# Patient Record
Sex: Male | Born: 1945
Health system: Southern US, Community
[De-identification: ages and names within clinical notes are randomized; demographics above are authoritative.]

## PROBLEM LIST (undated history)

## (undated) DIAGNOSIS — M545 Low back pain, unspecified: Secondary | ICD-10-CM

## (undated) DIAGNOSIS — E7800 Pure hypercholesterolemia, unspecified: Secondary | ICD-10-CM

## (undated) HISTORY — DX: Low back pain, unspecified: M54.50

## (undated) HISTORY — DX: Pure hypercholesterolemia, unspecified: E78.00

## (undated) HISTORY — PX: ESOPHAGOGASTRODUODENOSCOPY: SHX1529

## (undated) HISTORY — DX: Low back pain: M54.5

---

## 1998-09-01 ENCOUNTER — Encounter: Admission: RE | Admit: 1998-09-01 | Discharge: 1998-09-01 | Payer: Self-pay | Admitting: Family Medicine

## 1999-05-04 ENCOUNTER — Encounter: Admission: RE | Admit: 1999-05-04 | Discharge: 1999-05-04 | Payer: Self-pay | Admitting: Family Medicine

## 1999-07-10 ENCOUNTER — Encounter: Admission: RE | Admit: 1999-07-10 | Discharge: 1999-07-10 | Payer: Self-pay | Admitting: Family Medicine

## 2000-09-16 ENCOUNTER — Encounter: Admission: RE | Admit: 2000-09-16 | Discharge: 2000-09-16 | Payer: Self-pay | Admitting: Family Medicine

## 2002-03-23 ENCOUNTER — Encounter: Admission: RE | Admit: 2002-03-23 | Discharge: 2002-03-23 | Payer: Self-pay | Admitting: Family Medicine

## 2002-03-26 ENCOUNTER — Encounter: Admission: RE | Admit: 2002-03-26 | Discharge: 2002-03-26 | Payer: Self-pay | Admitting: Sports Medicine

## 2002-03-30 ENCOUNTER — Encounter: Admission: RE | Admit: 2002-03-30 | Discharge: 2002-03-30 | Payer: Self-pay | Admitting: Family Medicine

## 2003-04-12 ENCOUNTER — Encounter: Admission: RE | Admit: 2003-04-12 | Discharge: 2003-04-12 | Payer: Self-pay | Admitting: Family Medicine

## 2003-05-27 ENCOUNTER — Encounter: Admission: RE | Admit: 2003-05-27 | Discharge: 2003-05-27 | Payer: Self-pay | Admitting: Family Medicine

## 2005-01-22 ENCOUNTER — Ambulatory Visit: Payer: Self-pay | Admitting: Family Medicine

## 2005-01-22 ENCOUNTER — Encounter: Admission: RE | Admit: 2005-01-22 | Discharge: 2005-01-22 | Payer: Self-pay | Admitting: Family Medicine

## 2005-12-17 ENCOUNTER — Ambulatory Visit: Payer: Self-pay | Admitting: Family Medicine

## 2005-12-27 ENCOUNTER — Ambulatory Visit: Payer: Self-pay | Admitting: Family Medicine

## 2006-05-12 DIAGNOSIS — E7800 Pure hypercholesterolemia, unspecified: Secondary | ICD-10-CM | POA: Insufficient documentation

## 2006-06-01 ENCOUNTER — Encounter: Payer: Self-pay | Admitting: Family Medicine

## 2007-02-13 ENCOUNTER — Ambulatory Visit: Payer: Self-pay | Admitting: Family Medicine

## 2007-02-13 DIAGNOSIS — L570 Actinic keratosis: Secondary | ICD-10-CM

## 2007-02-14 ENCOUNTER — Ambulatory Visit: Payer: Self-pay | Admitting: Family Medicine

## 2007-02-14 ENCOUNTER — Encounter: Payer: Self-pay | Admitting: Family Medicine

## 2007-02-15 ENCOUNTER — Encounter: Payer: Self-pay | Admitting: Family Medicine

## 2007-02-15 LAB — CONVERTED CEMR LAB
ALT: 24 units/L (ref 0–53)
AST: 15 units/L (ref 0–37)
Albumin: 4.2 g/dL (ref 3.5–5.2)
Alkaline Phosphatase: 83 units/L (ref 39–117)
BUN: 17 mg/dL (ref 6–23)
CO2: 26 meq/L (ref 19–32)
Calcium: 9.2 mg/dL (ref 8.4–10.5)
Chloride: 105 meq/L (ref 96–112)
Cholesterol: 228 mg/dL — ABNORMAL HIGH (ref 0–200)
Creatinine, Ser: 0.88 mg/dL (ref 0.40–1.50)
Glucose, Bld: 108 mg/dL — ABNORMAL HIGH (ref 70–99)
HDL: 43 mg/dL (ref >39)
LDL Cholesterol: 162 mg/dL — ABNORMAL HIGH (ref 0–99)
PSA: 1.21 ng/mL (ref 0.10–4.00)
Potassium: 4.9 meq/L (ref 3.5–5.3)
Sodium: 142 meq/L (ref 135–145)
Total Bilirubin: 0.4 mg/dL (ref 0.3–1.2)
Total CHOL/HDL Ratio: 5.3
Total Protein: 7.3 g/dL (ref 6.0–8.3)
Triglycerides: 117 mg/dL (ref <150)
VLDL: 23 mg/dL (ref 0–40)

## 2007-03-20 ENCOUNTER — Telehealth: Payer: Self-pay | Admitting: *Deleted

## 2007-03-21 ENCOUNTER — Ambulatory Visit: Payer: Self-pay | Admitting: Family Medicine

## 2007-03-21 DIAGNOSIS — H905 Unspecified sensorineural hearing loss: Secondary | ICD-10-CM

## 2007-03-23 ENCOUNTER — Encounter: Payer: Self-pay | Admitting: Family Medicine

## 2007-03-23 ENCOUNTER — Telehealth (INDEPENDENT_AMBULATORY_CARE_PROVIDER_SITE_OTHER): Payer: Self-pay | Admitting: *Deleted

## 2007-03-23 ENCOUNTER — Encounter (INDEPENDENT_AMBULATORY_CARE_PROVIDER_SITE_OTHER): Payer: Self-pay | Admitting: *Deleted

## 2007-04-04 ENCOUNTER — Encounter: Payer: Self-pay | Admitting: Family Medicine

## 2007-04-05 ENCOUNTER — Ambulatory Visit: Payer: Self-pay | Admitting: Family Medicine

## 2007-04-05 LAB — CONVERTED CEMR LAB
ALT: 26 units/L (ref 0–53)
AST: 15 units/L (ref 0–37)
Albumin: 4.3 g/dL (ref 3.5–5.2)
Alkaline Phosphatase: 76 units/L (ref 39–117)
BUN: 21 mg/dL (ref 6–23)
CO2: 27 meq/L (ref 19–32)
CRP, High Sensitivity: 3.2 — ABNORMAL HIGH
Calcium: 9.5 mg/dL (ref 8.4–10.5)
Chloride: 105 meq/L (ref 96–112)
Cholesterol: 130 mg/dL (ref 0–200)
Creatinine, Ser: 1 mg/dL (ref 0.40–1.50)
Glucose, Bld: 103 mg/dL — ABNORMAL HIGH (ref 70–99)
HDL: 35 mg/dL — ABNORMAL LOW (ref >39)
LDL Cholesterol: 78 mg/dL (ref 0–99)
Potassium: 5.2 meq/L (ref 3.5–5.3)
Sodium: 141 meq/L (ref 135–145)
Total Bilirubin: 0.5 mg/dL (ref 0.3–1.2)
Total CHOL/HDL Ratio: 3.7
Total Protein: 7.2 g/dL (ref 6.0–8.3)
Triglycerides: 83 mg/dL (ref <150)
VLDL: 17 mg/dL (ref 0–40)

## 2007-09-19 IMAGING — CR DG CHEST 2V
2 series · 2 of 2 positions shown · non-contrast
Comparison: none

CLINICAL DATA: Two months cough.
 DIAGNOSTIC CHEST ? TWO VIEWS:
 No comparison.

[w chest pa]
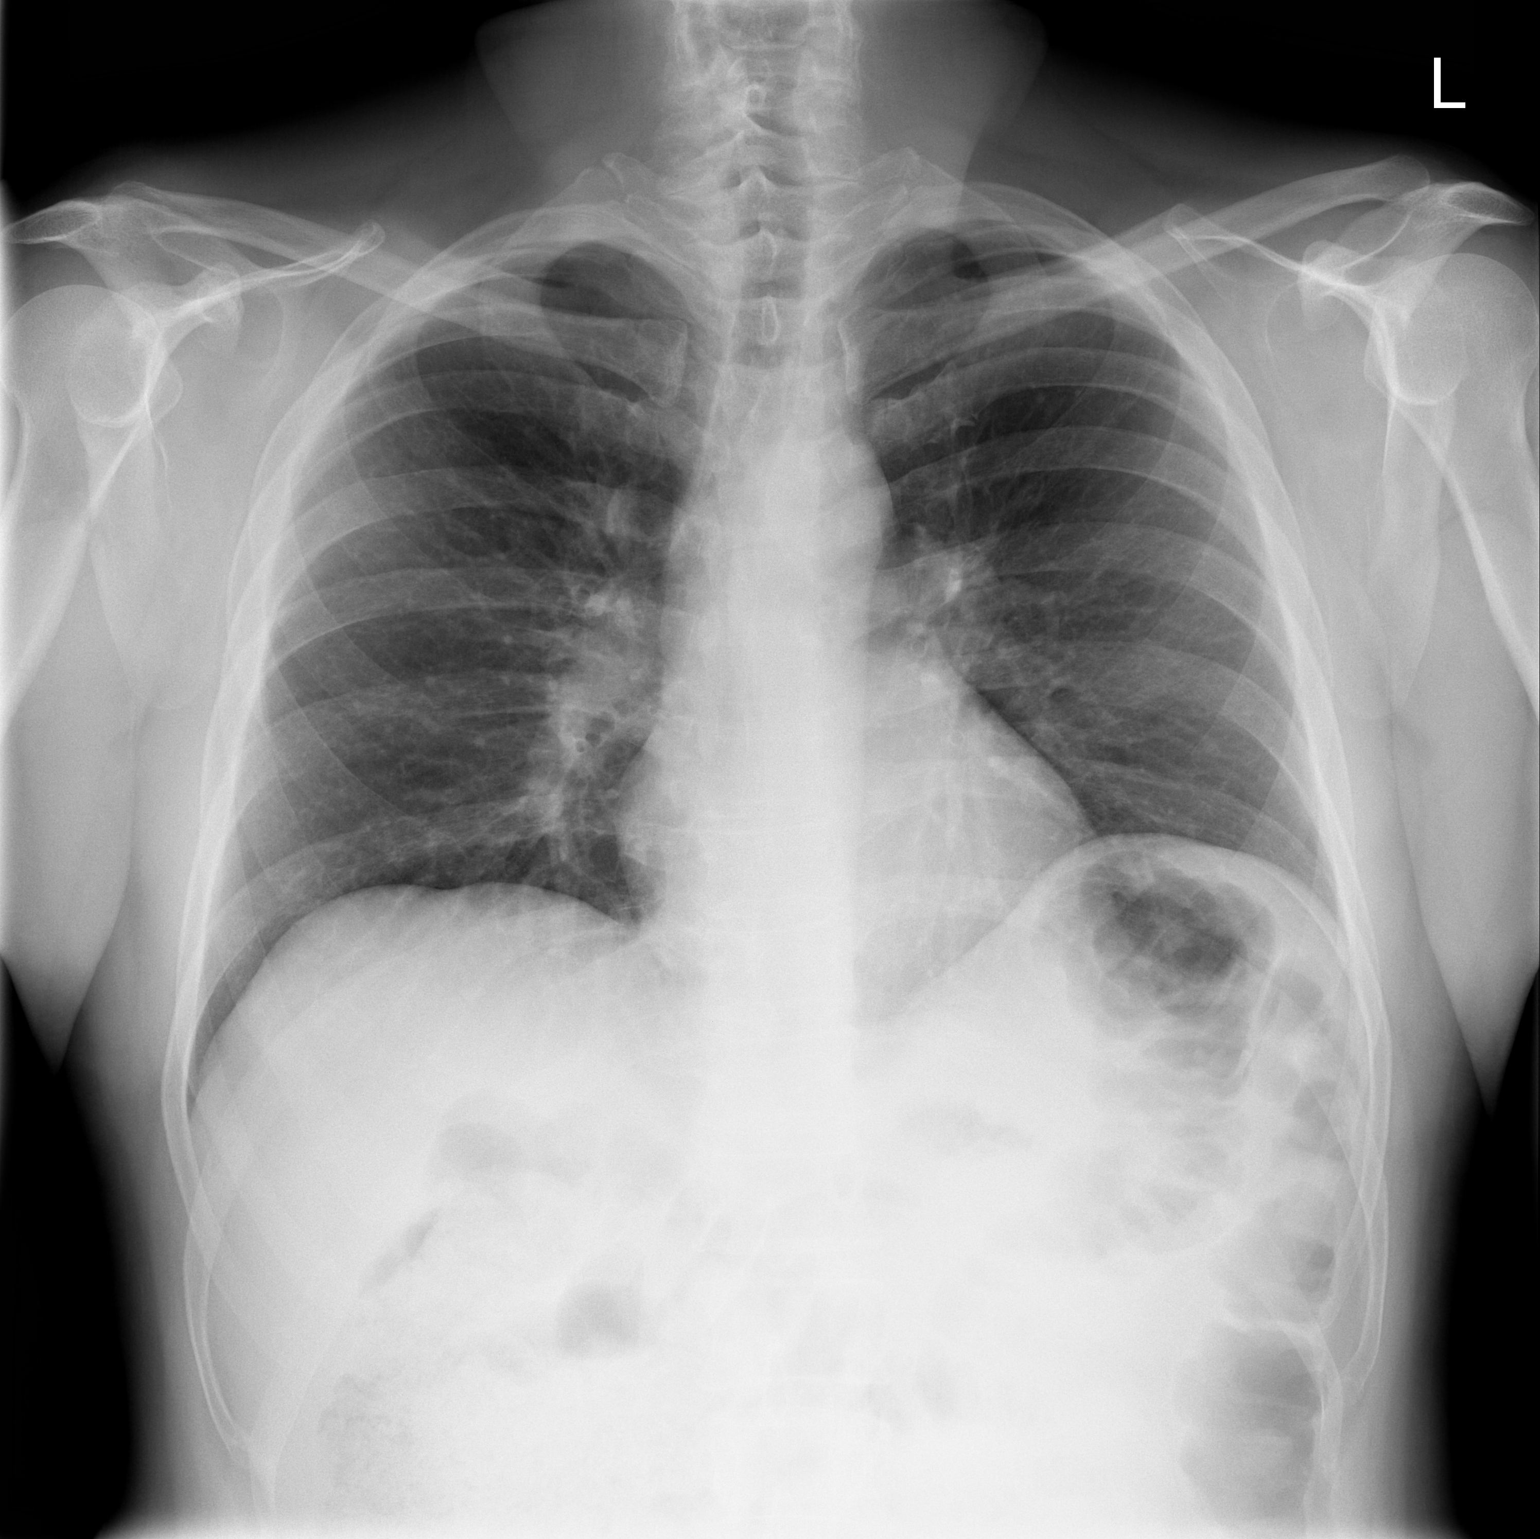

[w chest lat]
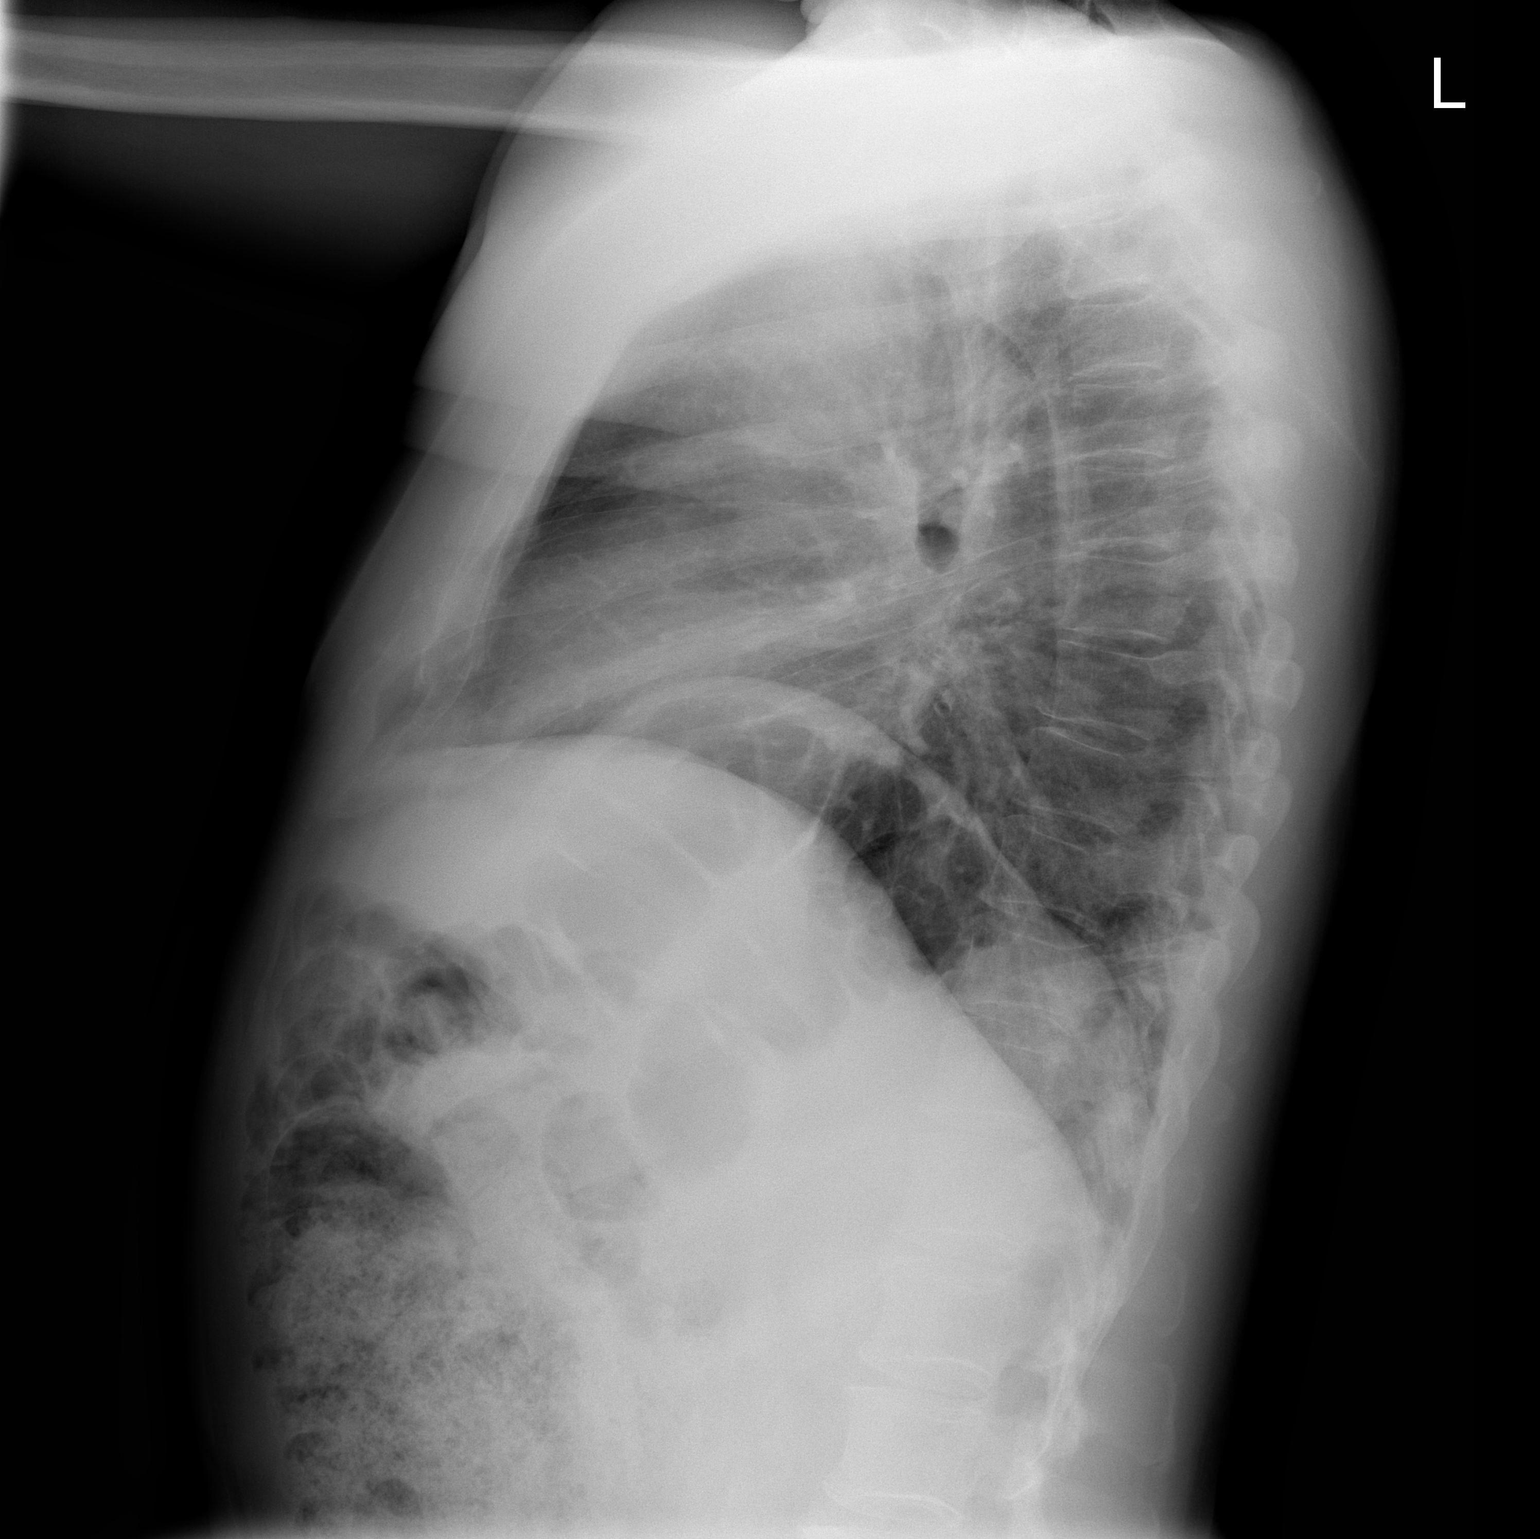

[2 of 2 positions shown; findings below may reference images not displayed]

FINDINGS: Submaximal inspiration is seen.  Prominence of bronchopulmonary markings of slight asthma or bronchitis are seen with the lungs otherwise clear.  Heart size is normal.   The mediastinum, hila, pleura and osseous structures are unremarkable.
IMPRESSION: 1.  Submaximal inspiration.
 2.  Slight changes of asthma or bronchitis.
 3.  Otherwise no active disease.

## 2008-01-10 ENCOUNTER — Encounter: Payer: Self-pay | Admitting: Family Medicine

## 2008-02-28 ENCOUNTER — Ambulatory Visit: Payer: Self-pay | Admitting: Family Medicine

## 2008-02-28 LAB — CONVERTED CEMR LAB
ALT: 39 units/L (ref 0–53)
AST: 25 units/L (ref 0–37)
Albumin: 4.6 g/dL (ref 3.5–5.2)
Alkaline Phosphatase: 75 units/L (ref 39–117)
BUN: 19 mg/dL (ref 6–23)
CO2: 25 meq/L (ref 19–32)
Calcium: 9.5 mg/dL (ref 8.4–10.5)
Chloride: 100 meq/L (ref 96–112)
Cholesterol: 140 mg/dL (ref 0–200)
Creatinine, Ser: 0.98 mg/dL (ref 0.40–1.50)
Glucose, Bld: 70 mg/dL (ref 70–99)
HDL: 47 mg/dL (ref >39)
LDL Cholesterol: 82 mg/dL (ref 0–99)
Potassium: 4.5 meq/L (ref 3.5–5.3)
Sodium: 140 meq/L (ref 135–145)
Total Bilirubin: 0.7 mg/dL (ref 0.3–1.2)
Total CHOL/HDL Ratio: 3
Total Protein: 7.6 g/dL (ref 6.0–8.3)
Triglycerides: 54 mg/dL (ref <150)
VLDL: 11 mg/dL (ref 0–40)
Vit D, 1,25-Dihydroxy: 41 (ref 30–89)

## 2009-01-17 ENCOUNTER — Encounter: Payer: Self-pay | Admitting: Family Medicine

## 2009-02-21 ENCOUNTER — Ambulatory Visit: Payer: Self-pay | Admitting: Family Medicine

## 2009-02-21 LAB — CONVERTED CEMR LAB
ALT: 25 units/L (ref 0–53)
AST: 23 units/L (ref 0–37)
Albumin: 4.6 g/dL (ref 3.5–5.2)
Alkaline Phosphatase: 69 units/L (ref 39–117)
BUN: 18 mg/dL (ref 6–23)
CO2: 21 meq/L (ref 19–32)
Calcium: 9.1 mg/dL (ref 8.4–10.5)
Chloride: 101 meq/L (ref 96–112)
Cholesterol: 139 mg/dL (ref 0–200)
Creatinine, Ser: 1.01 mg/dL (ref 0.40–1.50)
Glucose, Bld: 74 mg/dL (ref 70–99)
HDL: 47 mg/dL (ref >39)
LDL Cholesterol: 82 mg/dL (ref 0–99)
PSA: 1.05 ng/mL (ref 0.10–4.00)
Potassium: 5 meq/L (ref 3.5–5.3)
Sodium: 138 meq/L (ref 135–145)
Total Bilirubin: 0.7 mg/dL (ref 0.3–1.2)
Total CHOL/HDL Ratio: 3
Total Protein: 7.3 g/dL (ref 6.0–8.3)
Triglycerides: 49 mg/dL (ref <150)
VLDL: 10 mg/dL (ref 0–40)

## 2009-02-28 ENCOUNTER — Ambulatory Visit: Payer: Self-pay | Admitting: Family Medicine

## 2009-04-11 ENCOUNTER — Ambulatory Visit: Payer: Self-pay | Admitting: Family Medicine

## 2009-04-22 ENCOUNTER — Telehealth: Payer: Self-pay | Admitting: Family Medicine

## 2010-01-09 ENCOUNTER — Ambulatory Visit: Payer: Self-pay | Admitting: Family Medicine

## 2010-03-12 ENCOUNTER — Telehealth: Payer: Self-pay | Admitting: Family Medicine

## 2010-03-23 ENCOUNTER — Ambulatory Visit: Admission: RE | Admit: 2010-03-23 | Discharge: 2010-03-23 | Payer: Self-pay | Source: Home / Self Care

## 2010-03-23 ENCOUNTER — Encounter: Payer: Self-pay | Admitting: Family Medicine

## 2010-03-24 LAB — CONVERTED CEMR LAB
AST: 18 units/L (ref 0–37)
Albumin: 4.4 g/dL (ref 3.5–5.2)
Alkaline Phosphatase: 69 units/L (ref 39–117)
BUN: 17 mg/dL (ref 6–23)
Creatinine, Ser: 1.03 mg/dL (ref 0.40–1.50)
Glucose, Bld: 95 mg/dL (ref 70–99)
HDL: 47 mg/dL (ref >39)
LDL Cholesterol: 85 mg/dL (ref 0–99)
Total Bilirubin: 0.5 mg/dL (ref 0.3–1.2)
Total CHOL/HDL Ratio: 3
Triglycerides: 53 mg/dL (ref <150)
VLDL: 11 mg/dL (ref 0–40)

## 2010-03-25 ENCOUNTER — Ambulatory Visit
Admission: RE | Admit: 2010-03-25 | Discharge: 2010-03-25 | Payer: Self-pay | Source: Home / Self Care | Attending: Family Medicine | Admitting: Family Medicine

## 2010-04-16 NOTE — Assessment & Plan Note (Signed)
Summary: cpe/eo   Vital Signs:  Patient profile:   65 year old male Height:      67.5 inches Weight:      169 pounds BMI:     26.17 Temp:     98.4 degrees F oral Pulse rate:   64 / minute Pulse rhythm:   regular BP sitting:   130 / 74  (left arm) Cuff size:   regular  Vitals Entered By: Loralee Pacas CMA (March 25, 2010 2:24 PM) CC: cpe Is Patient Diabetic? No Pain Assessment Patient in pain? no        Primary Care Provider:  Doralee Albino MD  CC:  cpe.  History of Present Illness: For complete physical Retired - "I was born to be retired."  Happy, exercises regularly along with daily yoga and meditation.  Wt has dropped 10 lbs with retirement.   No concerns. Reviewed labs drawn prior to visit.  Habits & Providers  Alcohol-Tobacco-Diet     Alcohol drinks/day: <1     Alcohol Counseling: not indicated; patient does not drink     Tobacco Status: quit > 6 months     Year Quit: 30 yrs ago     Diet Comments: very healthy     Diet Counseling: not indicated; diet is assessed to be healthy  Exercise-Depression-Behavior     Does Patient Exercise: yes     Exercise Counseling: not indicated; exercise is adequate     Type of exercise: yoga and walking     Times/week: 7     Have you felt down or hopeless? no     Have you felt little pleasure in things? no     Depression Counseling: not indicated; screening negative for depression     STD Risk: never     Seat Belt Use: always     Sun Exposure: infrequent  Current Medications (verified): 1)  Ketoconazole 2 % Crea (Ketoconazole) .... Apply A Small Amount To Affected Area Twice A Day  Disp 30 Gram Tube 2)  Simvastatin 40 Mg  Tabs (Simvastatin) .... One By Mouth At Bedtime 3)  Aspirin 81 Mg  Tbec (Aspirin) .... One By Mouth Every Day  Allergies (verified): No Known Drug Allergies  Past History:  Past Medical History: Last updated: 05/12/2006 boderline elevated cholesterol, Hepatititis B and C antibody neg, low  back pain absent since ulcer Rx, ulcer H pylori + Rxed with no recurrence, viral hepatitis 1975  type A?, Zostavax given 10/07  Past Surgical History: Last updated: 05/12/2006 12/27/2005 ldl: 118 hdl: 46 tri: 70 - 01/19/2006, UGI endoscopy 1997 -  Family History: Last updated: 02/28/2009 - Ca, CVA, ETOHism, depression, + ASHD, DM, HBP, glaucoma + for prostate cancer so will screen until 74.  Social History: Last updated: 02/13/2007 nonsmoker; ETOH occaisional; retired Runner, broadcasting/film/video; exercise, walks 3 miles per day in summer, yoga year round; diet, very healthy.  Swims three times per week.  Risk Factors: Alcohol Use: <1 (03/25/2010) Diet: very healthy (03/25/2010) Exercise: yes (03/25/2010)  Risk Factors: Smoking Status: quit > 6 months (03/25/2010)  Social History: Sun Exposure-Excessive:  infrequent  Review of Systems  The patient denies anorexia, vision loss, decreased hearing, chest pain, syncope, dyspnea on exertion, peripheral edema, prolonged cough, abdominal pain, severe indigestion/heartburn, muscle weakness, unusual weight change, and abnormal bleeding.    Physical Exam  General:  Well-developed,well-nourished,in no acute distress; alert,appropriate and cooperative throughout examination Eyes:  No corneal or conjunctival inflammation noted. EOMI. Perrla. Funduscopic exam benign, without hemorrhages, exudates  or papilledema. Vision grossly normal. Ears:  External ear exam shows no significant lesions or deformities.  Otoscopic examination reveals clear canals, tympanic membranes are intact bilaterally without bulging, retraction, inflammation or discharge. Hearing is grossly normal bilaterally. Mouth:  Oral mucosa and oropharynx without lesions or exudates.  Teeth in good repair. Neck:  No deformities, masses, or tenderness noted. Lungs:  Normal respiratory effort, chest expands symmetrically. Lungs are clear to auscultation, no crackles or wheezes. Heart:  Normal rate and  regular rhythm. S1 and S2 normal without gallop, murmur, click, rub or other extra sounds. Abdomen:  Bowel sounds positive,abdomen soft and non-tender without masses, organomegaly or hernias noted. Extremities:  No clubbing, cyanosis, edema, or deformity noted with normal full range of motion of all joints.   Neurologic:  No cranial nerve deficits noted. Station and gait are normal. Plantar reflexes are down-going bilaterally. DTRs are symmetrical throughout. Sensory, motor and coordinative functions appear intact.   Impression & Recommendations:  Problem # 1:  Preventive Health Care (ICD-V70.0) Assessment Unchanged Great: up to date on all screening parameters.  Given remote hx of peptic ulcer with treated H pylori, and given + FH for CAD, will add 81 mg ASA daily for primary prevention of CAD.  No other changes.  Problem # 2:  HYPERCHOLESTEROLEMIA (ICD-272.0) Assessment: Improved  His updated medication list for this problem includes:    Simvastatin 40 Mg Tabs (Simvastatin) ..... One by mouth at bedtime  Labs Reviewed: SGOT: 18 (03/23/2010)   SGPT: 23 (03/23/2010)   HDL:47 (03/23/2010), 47 (02/21/2009)  LDL:85 (03/23/2010), 82 (02/21/2009)  Chol:143 (03/23/2010), 139 (02/21/2009)  Trig:53 (03/23/2010), 49 (02/21/2009)  Complete Medication List: 1)  Ketoconazole 2 % Crea (Ketoconazole) .... Apply a small amount to affected area twice a day  disp 30 gram tube 2)  Simvastatin 40 Mg Tabs (Simvastatin) .... One by mouth at bedtime 3)  Aspirin 81 Mg Tbec (Aspirin) .... One by mouth every day  Other Orders: [facility] - Est  65+ [phone])   Orders Added: 1)  [facility] - Est  65+ [[id number]]     Prevention & Chronic Care Immunizations   Influenza vaccine: Fluvax MCR  (01/09/2010)   Influenza vaccine due: 11/13/2009    Tetanus booster: 02/28/2009: Tdap   Tetanus booster due: 03/01/2019    Pneumococcal vaccine: Done.  (12/13/2005)   Pneumococcal vaccine due: None    H. zoster vaccine:  12/22/2005: given  Colorectal Screening   Hemoccult: Done.  (03/16/2003)   Hemoccult due: Not Indicated    Colonoscopy: Done.  (04/16/2003)   Colonoscopy due: 04/15/2013  Other Screening   PSA: 1.24  (03/23/2010)   PSA due due: 02/21/2010   Smoking status: quit > 6 months  (03/25/2010)  Lipids   Total Cholesterol: 143  (03/23/2010)   LDL: 85  (03/23/2010)   LDL Direct: Not documented   HDL: 47  (03/23/2010)   Triglycerides: 53  (03/23/2010)    SGOT (AST): 18  (03/23/2010)   SGPT (ALT): 23  (03/23/2010)   Alkaline phosphatase: 69  (03/23/2010)   Total bilirubin: 0.5  (03/23/2010)    Lipid flowsheet reviewed?: Yes   Progress toward LDL goal: At goal  Self-Management Support :   Personal Goals (by the next clinic visit) :      Personal LDL goal: 130  (02/28/2009)    Lipid self-management support: Written self-care plan  (02/28/2009)

## 2010-04-16 NOTE — Assessment & Plan Note (Signed)
Summary: flu shot,df  Nurse Visit   Vital Signs:  Patient profile:   65 year old male Temp:     98.5 degrees F  Vitals Entered By: Theresia Lo RN (January 09, 2010 4:20 PM)  Allergies: No Known Drug Allergies  Immunizations Administered:  Influenza Vaccine # 1:    Vaccine Type: Fluvax MCR    Site: left deltoid    Mfr: GlaxoSmithKline    Dose: 0.5 ml    Route: IM    Given by: Theresia Lo RN    Exp. Date: 09/09/2010    Lot #: [id number]    VIS given: 10/07/09 version given January 09, 2010.  Flu Vaccine Consent Questions:    Do you have a history of severe allergic reactions to this vaccine? no    Any prior history of allergic reactions to egg and/or gelatin? no    Do you have a sensitivity to the preservative Thimersol? no    Do you have a past history of Guillan-Barre Syndrome? no    Do you currently have an acute febrile illness? no    Have you ever had a severe reaction to latex? no    Vaccine information given and explained to patient? yes  Orders Added: 1)  Influenza Vaccine MCR [00025] 2)  Administration Flu vaccine - MCR [G0008]

## 2010-04-16 NOTE — Progress Notes (Signed)
Summary: Rx Req  Phone Note Refill Request Call back at [phone] Message from:  Patient  Refills Requested: Medication #1:  KETOCONAZOLE 2 % CREA Apply a small amount to affected area twice a day  Disp 30 gram tube PT IS OUT OF TOWN AND WOULD LIKE FOR IT TO BE SENT TO [facility] [place] THE NUMBER IS [phone].  Initial call taken by: Clydell Hakim,  April 22, 2009 9:17 AM  Follow-up for Phone Call        will forward to MD. Follow-up by: Theresia Lo RN,  April 22, 2009 11:27 AM  Additional Follow-up for Phone Call Additional follow up Details #1::        Rxed called as requested Additional Follow-up by: Doralee Albino MD,  April 22, 2009 12:23 PM    Additional Follow-up for Phone Call Additional follow up Details #2::    PATIENT NOTIFIED. Follow-up by: Theresia Lo RN,  April 22, 2009 1:49 PM

## 2010-04-16 NOTE — Assessment & Plan Note (Signed)
Summary: MOLE REMOVAL,DF   Vital Signs:  Patient profile:   65 year old male Height:      67.5 inches Weight:      179.56 pounds Temp:     98.0 degrees F oral Pulse rate:   67 / minute BP sitting:   131 / 78  (left arm)  Vitals Entered By: Terese Door (April 11, 2009 4:11 PM) CC: freeze mole on left shoulder Is Patient Diabetic? No Pain Assessment Patient in pain? no        Primary Care Provider:  Doralee Albino MD  CC:  freeze mole on left shoulder.  History of Present Illness: Has an elevated skin lesion on left should.    Habits & Providers  Alcohol-Tobacco-Diet     Tobacco Status: quit > 6 months  Current Medications (verified): 1)  Ketoconazole 2 % Crea (Ketoconazole) .... Apply A Small Amount To Affected Area Twice A Day  Disp 30 Gram Tube 2)  Simvastatin 40 Mg  Tabs (Simvastatin) .... One By Mouth At Bedtime  Allergies (verified): No Known Drug Allergies  Past History:  Past medical, surgical, family and social histories (including risk factors) reviewed, and no changes noted (except as noted below).  Past Medical History: Reviewed history from 05/12/2006 and no changes required. boderline elevated cholesterol, Hepatititis B and C antibody neg, low back pain absent since ulcer Rx, ulcer H pylori + Rxed with no recurrence, viral hepatitis 1975  type A?, Zostavax given 10/07  Past Surgical History: Reviewed history from 05/12/2006 and no changes required. 12/27/2005 ldl: 118 hdl: 46 tri: 70 - 01/19/2006, UGI endoscopy 1997 -  Family History: Reviewed history from 02/28/2009 and no changes required. - Ca, CVA, ETOHism, depression, + ASHD, DM, HBP, glaucoma + for prostate cancer so will screen until 74.  Social History: Reviewed history from 02/13/2007 and no changes required. nonsmoker; ETOH occaisional; retired Runner, broadcasting/film/video; exercise, walks 3 miles per day in summer, yoga year round; diet, very healthy.  Swims three times per week.Smoking Status:  quit >  6 months  Physical Exam  General:  Well-developed,well-nourished,in no acute distress; alert,appropriate and cooperative throughout examination Skin:  actinic keratosis, left shoulder, frozen   Impression & Recommendations:  Problem # 1:  ACTINIC KERATOSIS (ICD-702.0)  Orders: [facility]- Est Level  3 (04540)  Complete Medication List: 1)  Ketoconazole 2 % Crea (Ketoconazole) .... Apply a small amount to affected area twice a day  disp 30 gram tube 2)  Simvastatin 40 Mg Tabs (Simvastatin) .... One by mouth at bedtime  Patient Instructions: 1)  You had an actinic keratosis.  Treat the wound with antibiotic ointment and a bandaid.

## 2010-04-16 NOTE — Progress Notes (Signed)
Summary: Orders  Phone Note Call from Patient Call back at [phone]   Summary of Call: pt req lab order for his physical on 1/11. Initial call taken by: Knox Royalty,  March 12, 2010 3:28 PM  Follow-up for Phone Call        orders entered and patient notified Follow-up by: Doralee Albino MD,  March 13, 2010 8:41 AM

## 2010-05-26 ENCOUNTER — Encounter: Payer: Self-pay | Admitting: Home Health Services

## 2010-07-10 ENCOUNTER — Telehealth: Payer: Self-pay | Admitting: Family Medicine

## 2010-07-10 NOTE — Telephone Encounter (Signed)
Dr. Leveda Anna, I have appended the visit for 03/25/10 in [facility] to add the Welcome to Medicare procedure code.  Please go into appended visit and sign off on this.  I will also ask the billing office to resubmit the charge so that Mr. Sessums will not be billed.  They have already received one form their insurance company.

## 2010-07-13 NOTE — Telephone Encounter (Signed)
Done

## 2011-02-16 ENCOUNTER — Other Ambulatory Visit: Payer: Self-pay | Admitting: Family Medicine

## 2011-02-16 DIAGNOSIS — E7800 Pure hypercholesterolemia, unspecified: Secondary | ICD-10-CM

## 2011-02-16 MED ORDER — SIMVASTATIN 40 MG PO TABS: 40.0000 mg | ORAL_TABLET | Freq: Every day | ORAL | Status: DC

## 2011-02-16 NOTE — Assessment & Plan Note (Signed)
Refilled per request.

## 2011-03-22 ENCOUNTER — Telehealth: Payer: Self-pay | Admitting: Family Medicine

## 2011-03-22 DIAGNOSIS — R1900 Intra-abdominal and pelvic swelling, mass and lump, unspecified site: Secondary | ICD-10-CM

## 2011-03-22 NOTE — Assessment & Plan Note (Signed)
I have copied the e mail correspondence: I guess I'd better get it "ultrasounded", eh. I pretty impressed that you send a reply on a Sunday. I'm glad you're my doctor. Rosanne Ashing ---- "Hensel wrote:  > The little followed but official recommendation is for men who have ever smoked to have a one time age 67 abdominal ultrasound to screen for an aortic aneurism.  Let me know and I will have my nurse set this up for you.   > Bill >  > Sent from my iPad >  > On Mar 21, 2011, at 12:49 PM, "[email or web address] wrote: >  > > Hi Dr. Leveda Anna, > > Happy New Year! I will be coming in for my yearly checkup in January. 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 Today she called to let me know that during a demonstration of a supine pose she noticed a pulse in my abdomen and that she would feel remiss if she didn't mention it. I had a brief discussion with her and she said that she recommends an abdominal ultrasound for men over 65. Anyway, I have no symptoms and wouldn't even have known that I had a pulse there. I looked it up on the [facility] website and found our a little bit about it. At any rate perhaps when I come in you could take a look. I'm only E-mailing you because I was afraid I may forget about it when I see you. See you soon. > > Aviva Signs

## 2011-03-22 NOTE — Telephone Encounter (Signed)
Forwarded to Dr. Leveda Anna for order to be placed.Loralee Pacas [place]

## 2011-03-22 NOTE — Telephone Encounter (Signed)
Patient spoke with Dr. Leveda Anna via email about having an Abdominal Ultrasound done.  Dr. Leveda Anna was going to put a referral in for this.  The patient can be reached at [phone] today or tomorrow but will be at his regular #'s after that.

## 2011-03-25 ENCOUNTER — Ambulatory Visit
Admission: RE | Admit: 2011-03-25 | Discharge: 2011-03-25 | Disposition: A | Discharge status: Home / Self Care | Payer: Medicare Other | Source: Ambulatory Visit | Attending: Family Medicine | Admitting: Family Medicine

## 2011-03-25 ENCOUNTER — Other Ambulatory Visit: Payer: Self-pay | Admitting: Family Medicine

## 2011-03-25 DIAGNOSIS — R1900 Intra-abdominal and pelvic swelling, mass and lump, unspecified site: Secondary | ICD-10-CM

## 2011-03-25 DIAGNOSIS — E7800 Pure hypercholesterolemia, unspecified: Secondary | ICD-10-CM

## 2011-03-25 NOTE — Assessment & Plan Note (Signed)
Will check labs in advance of his annual assessment

## 2011-03-30 ENCOUNTER — Other Ambulatory Visit: Payer: Medicare Other

## 2011-03-30 DIAGNOSIS — E7800 Pure hypercholesterolemia, unspecified: Secondary | ICD-10-CM

## 2011-03-30 NOTE — Progress Notes (Signed)
cmp and flp done today John Chung 

## 2011-03-31 ENCOUNTER — Ambulatory Visit (INDEPENDENT_AMBULATORY_CARE_PROVIDER_SITE_OTHER): Payer: Medicare Other | Admitting: Family Medicine

## 2011-03-31 ENCOUNTER — Encounter: Payer: Self-pay | Admitting: Family Medicine

## 2011-03-31 VITALS — BP 130/68 | HR 64 | Temp 98.1°F | Ht 67.5 in | Wt 174.8 lb

## 2011-03-31 DIAGNOSIS — R1900 Intra-abdominal and pelvic swelling, mass and lump, unspecified site: Secondary | ICD-10-CM

## 2011-03-31 DIAGNOSIS — E7800 Pure hypercholesterolemia, unspecified: Secondary | ICD-10-CM

## 2011-03-31 DIAGNOSIS — Z23 Encounter for immunization: Secondary | ICD-10-CM

## 2011-03-31 LAB — COMPLETE METABOLIC PANEL WITH GFR
ALT: 28 U/L (ref 0–53)
CO2: 30 mEq/L (ref 19–32)
Calcium: 9.5 mg/dL (ref 8.4–10.5)
Chloride: 105 mEq/L (ref 96–112)
GFR, Est African American: >89 mL/min
Sodium: 141 mEq/L (ref 135–145)
Total Bilirubin: 0.5 mg/dL (ref 0.3–1.2)
Total Protein: 6.9 g/dL (ref 6.0–8.3)

## 2011-03-31 LAB — LIPID PANEL
HDL: 46 mg/dL (ref >39)
LDL Cholesterol: 77 mg/dL (ref 0–99)
VLDL: 15 mg/dL (ref 0–40)

## 2011-03-31 MED ORDER — PNEUMOCOCCAL VAC POLYVALENT 25 MCG/0.5ML IJ INJ: 0.5000 mL | INJECTION | Freq: Once | INTRAMUSCULAR | Status: DC

## 2011-03-31 NOTE — Patient Instructions (Addendum)
You received your last ever pneumonia shot today.  You don't need any more. Remember to start taking a baby (81 mg) aspirin every day. See Arlys John for your annual medicare wellness exam.

## 2011-04-02 NOTE — Progress Notes (Signed)
  Subjective:    Patient ID: John Chung, male    DOB: 25-Jan-1946, 66 y.o.   MRN: [id number]  HPI  John Chung comes in for annual recheck of his annual recheck of hypercholesterolemia.  He has a very healthy lifestyle.  He eats well and does 3 hours of yoga daily (1 hour meditation and 2 hours of exercise.)  He has no complaints.  Tolerating simvastatin well  He has not been taking his ASA.  We discussed again primary CAD, CVA prevention and he does have a family hx of both.  He worries since he had a previous ulcer - but that was years ago and related to H pylori, which was treated.  Recommended 81 mg ASA    Review of Systems     Objective:   Physical Exam Lungs clear Cardiac RRR without m or g Abd benign Ext no edema.  Nl pulses.        Assessment & Plan:

## 2011-04-02 NOTE — Assessment & Plan Note (Signed)
Cholesterol panel great on simvastatin.  We discussed again primary CAD, CVA prevention and he does have a family hx of both.  He worries since he had a previous ulcer - but that was years ago and related to H pylori, which was treated.  Recommended 81 mg ASA

## 2011-04-02 NOTE — Assessment & Plan Note (Signed)
Ultrasound showed nl aorta.

## 2011-12-13 ENCOUNTER — Encounter (INDEPENDENT_AMBULATORY_CARE_PROVIDER_SITE_OTHER): Payer: Medicare Other | Admitting: Ophthalmology

## 2011-12-13 DIAGNOSIS — H35379 Puckering of macula, unspecified eye: Secondary | ICD-10-CM

## 2011-12-13 DIAGNOSIS — H2510 Age-related nuclear cataract, unspecified eye: Secondary | ICD-10-CM

## 2011-12-13 DIAGNOSIS — H3530 Unspecified macular degeneration: Secondary | ICD-10-CM

## 2011-12-13 DIAGNOSIS — H43819 Vitreous degeneration, unspecified eye: Secondary | ICD-10-CM

## 2011-12-14 ENCOUNTER — Encounter: Payer: Self-pay | Admitting: Family Medicine

## 2011-12-14 ENCOUNTER — Other Ambulatory Visit: Payer: Self-pay | Admitting: Family Medicine

## 2011-12-14 DIAGNOSIS — H3530 Unspecified macular degeneration: Secondary | ICD-10-CM | POA: Insufficient documentation

## 2011-12-14 MED ORDER — PRESERVISION AREDS 2 PO CAPS: 1.0000 | ORAL_CAPSULE | Freq: Every day | ORAL | Status: AC

## 2011-12-14 NOTE — Assessment & Plan Note (Signed)
Vits per ophth

## 2012-02-21 ENCOUNTER — Other Ambulatory Visit: Payer: Self-pay | Admitting: Family Medicine

## 2012-03-23 ENCOUNTER — Telehealth: Payer: Self-pay | Admitting: Family Medicine

## 2012-03-23 DIAGNOSIS — E7800 Pure hypercholesterolemia, unspecified: Secondary | ICD-10-CM

## 2012-03-23 NOTE — Telephone Encounter (Signed)
[employer] will not pay for a CPE anymore but patient is asking for Dr Leveda Anna to put orders in for the same labs as last year.  Please let him know when orders are in.

## 2012-03-23 NOTE — Telephone Encounter (Signed)
Will route request to Dr. Leveda Anna.  Gaylene Brooks, RN

## 2012-03-24 NOTE — Assessment & Plan Note (Signed)
Will order labs - future orders

## 2012-03-24 NOTE — Telephone Encounter (Signed)
Patient informed and scheduled fasting lab appt for 04/04/12 at 8:45 am.  Gaylene Brooks, RN

## 2012-04-04 ENCOUNTER — Other Ambulatory Visit: Payer: Medicare PPO

## 2012-04-04 DIAGNOSIS — E7800 Pure hypercholesterolemia, unspecified: Secondary | ICD-10-CM

## 2012-04-04 NOTE — Progress Notes (Signed)
CMP AND FLP DONE TODAY Braylyn Kalter 

## 2012-04-05 ENCOUNTER — Encounter: Payer: Self-pay | Admitting: Family Medicine

## 2012-04-05 LAB — LIPID PANEL
HDL: 50 mg/dL (ref >39)
LDL Cholesterol: 85 mg/dL (ref 0–99)
Total CHOL/HDL Ratio: 3 Ratio

## 2012-04-05 LAB — COMPLETE METABOLIC PANEL WITH GFR
ALT: 29 U/L (ref 0–53)
Alkaline Phosphatase: 62 U/L (ref 39–117)
CO2: 29 mEq/L (ref 19–32)
Sodium: 141 mEq/L (ref 135–145)
Total Bilirubin: 0.5 mg/dL (ref 0.3–1.2)
Total Protein: 7.1 g/dL (ref 6.0–8.3)

## 2012-04-12 ENCOUNTER — Encounter: Payer: Medicare Other | Admitting: Family Medicine

## 2012-06-14 ENCOUNTER — Ambulatory Visit (INDEPENDENT_AMBULATORY_CARE_PROVIDER_SITE_OTHER): Payer: Medicare PPO | Admitting: Family Medicine

## 2012-06-14 ENCOUNTER — Encounter: Payer: Self-pay | Admitting: Family Medicine

## 2012-06-14 VITALS — BP 116/59 | HR 57 | Temp 98.1°F | Ht 67.5 in | Wt 158.0 lb

## 2012-06-14 DIAGNOSIS — L570 Actinic keratosis: Secondary | ICD-10-CM

## 2012-06-15 NOTE — Assessment & Plan Note (Signed)
AK on arm, seb K on torso.

## 2012-06-15 NOTE — Patient Instructions (Signed)
You have been through this before.  Keep triple antibiotic once frozen areas blister.

## 2012-06-15 NOTE — Progress Notes (Signed)
  Subjective:    Patient ID: John Chung, male    DOB: 05-31-1945, 67 y.o.   MRN: [id number]  HPI Several skin lesions to evaluate.  Biggest on arm. Several on torso    Review of Systems     Objective:   Physical Exam Arm is large AK Torso multiple seb Ks.  All frozen        Assessment & Plan:

## 2012-09-13 ENCOUNTER — Ambulatory Visit (INDEPENDENT_AMBULATORY_CARE_PROVIDER_SITE_OTHER): Payer: Medicare Other | Admitting: Ophthalmology

## 2012-09-20 ENCOUNTER — Ambulatory Visit (INDEPENDENT_AMBULATORY_CARE_PROVIDER_SITE_OTHER): Payer: Medicare HMO | Admitting: Ophthalmology

## 2012-09-20 DIAGNOSIS — H35379 Puckering of macula, unspecified eye: Secondary | ICD-10-CM

## 2012-09-20 DIAGNOSIS — H43819 Vitreous degeneration, unspecified eye: Secondary | ICD-10-CM

## 2012-09-20 DIAGNOSIS — H3530 Unspecified macular degeneration: Secondary | ICD-10-CM

## 2012-09-20 DIAGNOSIS — H2510 Age-related nuclear cataract, unspecified eye: Secondary | ICD-10-CM

## 2012-09-25 ENCOUNTER — Telehealth: Payer: Self-pay | Admitting: Family Medicine

## 2012-09-25 NOTE — Telephone Encounter (Signed)
Pt is  Having problem with the right hand where the  base of thumb is. He made an appointment for 9/10 to see Dr. Leveda Anna and just wants advice on what to do until then. He didn't want to come in before that. JW

## 2012-09-25 NOTE — Telephone Encounter (Signed)
Left message for pt to call for earlier appointment and return call for advice as needed. Wyatt Haste, RN-BSN

## 2012-09-26 NOTE — Telephone Encounter (Signed)
Called and discussed.  He has thumb pain and a "nodule"  Also has guitar camp coming up.  Will be happy to see him (8/22 would be a day that works for him and I am seeing patients that morning.)  Also, I would be happy to refer to hand surgeon if he want prompt, definitive eval before guitar camp.

## 2012-09-28 ENCOUNTER — Encounter: Payer: Self-pay | Admitting: Sports Medicine

## 2012-09-28 ENCOUNTER — Ambulatory Visit (INDEPENDENT_AMBULATORY_CARE_PROVIDER_SITE_OTHER): Payer: Medicare PPO | Admitting: Sports Medicine

## 2012-09-28 ENCOUNTER — Ambulatory Visit: Payer: Medicare PPO

## 2012-09-28 VITALS — BP 124/79 | HR 60 | Temp 97.5°F | Ht 67.5 in | Wt 158.6 lb

## 2012-09-28 DIAGNOSIS — M6530 Trigger finger, unspecified finger: Secondary | ICD-10-CM | POA: Insufficient documentation

## 2012-09-28 MED ORDER — MELOXICAM 15 MG PO TABS: 15.0000 mg | ORAL_TABLET | Freq: Every day | ORAL | Status: DC

## 2012-09-28 NOTE — Patient Instructions (Addendum)
It was nice to see you today.   Today we discussed: 1. Trigger finger, acquired Try: - meloxicam (MOBIC) 15 MG tablet; Take 1 tablet (15 mg total) by mouth daily.  Dispense: 30 tablet; Refill: 0  Do gentle stretching and use ice up to 3 times per day.  Cool tap water is likely cool enough to soak your hand  Call [facility] (Dr. Darrick Penna and [place])to have this evaluated further if not significantly improved.  Their number is [phone]   Please plan to return to see Dr. Leveda Anna as needed.  If you need anything prior to seeing me please call the clinic.  ENJOY YOUR SUMMER!  Please Bring all medications with you to each appointment.

## 2012-09-28 NOTE — Assessment & Plan Note (Addendum)
Will tx with Mobic and continue conservative care per AVS and instructions provided pt Patient has seen  and specialist in the past but is not interested in returning at this time as he does not want to have surgery.   Patient is agreeable to this plan but would prefer to have had an injection today.   > Recommend that if not improved to return to [facility] clinic for ultrasound guided injection

## 2012-09-28 NOTE — Progress Notes (Signed)
  [facility]  Patient name: John Chung MRN [id number]  Date of birth: November 06, 1945  CC & HPI:  John Chung is a 67 y.o. male presenting today for:  # R Thumb pain:  Context  right thumb pain with flexion only.   Nodule on deep tendon appreciated   Onset/Duration:   2 months, progressively worsening.  Was initially popping slightly but now is tender with active flexion or passive extension/abduction  Illicit ing Event/Injury:  No  Character:   pain with movement that is nonradiating. He is a good sharp layer is able to hold his PICC without difficulty. Also has a Dupuytren's contracture of the fifth finger on the same side   Severity:  moderate to severe  Radiation:  none  Aggravating:  see above  Associated Symptoms:    Effective Therapies:  ice  Inneffective Therapies:  nothing  RED FLAGS:  no fevers, chill, erythema    ROS:  PER HPI  Pertinent History Reviewed:  Medical & Surgical Hx:  Reviewed: Significant for HLD,  Medications: Reviewed & Updated - see associated section Social History: Reviewed -  reports that he has quit smoking. He quit smokeless tobacco use about 31 years ago.  Objective Findings:  Vitals: BP 124/79  Pulse 60  Temp(Src) 97.5 F (36.4 C) (Oral)  Ht 5' 7.5" (1.715 m)  Wt 158 lb 9.6 oz (71.94 kg)  BMI 24.46 kg/m2  PE: GENERAL: Adult caucasian  male. Appears younger than ageIn no discomfort; no respiratory distress  PSYCH:  alert and appropriate, good insight   HNEENT:    CARDIO:    LUNGS:    ABDOMEN:    EXTREM:   GU:   SKIN:   NEUROMSK:  right thumb with nodule over the deep tendon of the flexor pollicus longus likely distal to the A1 pulley  Dupuytren's contracture of R 5th finger - patient is not interested in getting this fixed he.  He has had this for a long time.      Assessment & Plan:

## 2012-11-03 ENCOUNTER — Ambulatory Visit: Payer: Medicare PPO | Admitting: Family Medicine

## 2012-11-22 ENCOUNTER — Ambulatory Visit: Payer: Medicare PPO | Admitting: Family Medicine

## 2013-01-02 ENCOUNTER — Ambulatory Visit (INDEPENDENT_AMBULATORY_CARE_PROVIDER_SITE_OTHER): Payer: Medicare PPO | Admitting: *Deleted

## 2013-01-02 DIAGNOSIS — Z23 Encounter for immunization: Secondary | ICD-10-CM

## 2013-02-26 ENCOUNTER — Other Ambulatory Visit: Payer: Self-pay | Admitting: Family Medicine

## 2013-02-26 ENCOUNTER — Encounter: Payer: Self-pay | Admitting: *Deleted

## 2013-03-21 ENCOUNTER — Encounter: Payer: Self-pay | Admitting: Internal Medicine

## 2013-05-15 ENCOUNTER — Telehealth: Payer: Self-pay | Admitting: Family Medicine

## 2013-05-15 ENCOUNTER — Encounter: Payer: Self-pay | Admitting: Family Medicine

## 2013-05-15 DIAGNOSIS — Z114 Encounter for screening for human immunodeficiency virus [HIV]: Secondary | ICD-10-CM

## 2013-05-15 DIAGNOSIS — E7800 Pure hypercholesterolemia, unspecified: Secondary | ICD-10-CM

## 2013-05-15 DIAGNOSIS — M25519 Pain in unspecified shoulder: Secondary | ICD-10-CM

## 2013-05-15 NOTE — Telephone Encounter (Signed)
Yes. Could you put in a referral for insurance purposes. Thanks, Also,  need to have my yearly blood checkup. Clair Gulling ---- "Hensel wrote:  > [name], > Pain in and around the shoulder can be a little tricky to diagnose.  From a pure numbers standpoint, a rotator cuff strain would be more common than a deltoid strain.  As a physical therapist, Jenny Reichmann should be able to tell you if you got the diagnosis right and treat whatever ails you.   >  > If it is a rotator cuff problem, it can stubbornly refuse to go away.  Physical therapy is a good option.  I also give cortisone injections in the shoulder for problems that have failed conservative treatment. >  > Let me know if I need to put in a Physical Therapy referral for insurance purposes.  Good luck and hope you feel better soon. >  > Bill >  > -----Original Message----- > From: Marla Roe [[email or web address]]  > Sent: Monday, May 14, 2013 3:28 PM > To: Hensel, Bill > Subject: Deltoid strain >  >  >  >  > Hi Dr. Andria Frames, > I think I have a deltoid strain. I've been treating with my ultra sound and massage and ice and ibuprofen. I know it takes time. Do you have any advice? Can I go to [facility] [place]? Marcelline Deist

## 2013-05-15 NOTE — Telephone Encounter (Signed)
FU e mail encounter.  Wants HIV and Hep C screening with annual blood work.

## 2013-05-15 NOTE — Assessment & Plan Note (Signed)
Will screen for Hep c and HIV per latest recs.

## 2013-05-28 ENCOUNTER — Encounter: Payer: Self-pay | Admitting: Internal Medicine

## 2013-05-30 ENCOUNTER — Other Ambulatory Visit: Payer: Medicare PPO

## 2013-05-30 DIAGNOSIS — Z114 Encounter for screening for human immunodeficiency virus [HIV]: Secondary | ICD-10-CM

## 2013-05-30 DIAGNOSIS — E7800 Pure hypercholesterolemia, unspecified: Secondary | ICD-10-CM

## 2013-05-30 LAB — LIPID PANEL
Cholesterol: 164 mg/dL (ref 0–200)
HDL: 50 mg/dL (ref >39)
LDL Cholesterol: 100 mg/dL — ABNORMAL HIGH (ref 0–99)
Total CHOL/HDL Ratio: 3.3 Ratio
Triglycerides: 72 mg/dL (ref <150)
VLDL: 14 mg/dL (ref 0–40)

## 2013-05-30 LAB — COMPREHENSIVE METABOLIC PANEL
ALBUMIN: 4.3 g/dL (ref 3.5–5.2)
ALK PHOS: 67 U/L (ref 39–117)
ALT: 20 U/L (ref 0–53)
AST: 19 U/L (ref 0–37)
BILIRUBIN TOTAL: 0.5 mg/dL (ref 0.2–1.2)
BUN: 17 mg/dL (ref 6–23)
CO2: 32 mEq/L (ref 19–32)
Calcium: 9.2 mg/dL (ref 8.4–10.5)
Chloride: 102 mEq/L (ref 96–112)
Creat: 0.8 mg/dL (ref 0.50–1.35)
GLUCOSE: 92 mg/dL (ref 70–99)
POTASSIUM: 4.3 meq/L (ref 3.5–5.3)
SODIUM: 141 meq/L (ref 135–145)
TOTAL PROTEIN: 6.9 g/dL (ref 6.0–8.3)

## 2013-05-30 NOTE — Progress Notes (Signed)
CMP,FLP,HIV AND HEPCAB DONE TODAY John Chung

## 2013-05-31 LAB — HEPATITIS C ANTIBODY, REFLEX: HCV AB: NEGATIVE

## 2013-05-31 LAB — HIV ANTIBODY (ROUTINE TESTING W REFLEX): HIV: NONREACTIVE

## 2013-06-01 ENCOUNTER — Encounter: Payer: Self-pay | Admitting: Family Medicine

## 2013-06-27 ENCOUNTER — Ambulatory Visit (INDEPENDENT_AMBULATORY_CARE_PROVIDER_SITE_OTHER): Payer: Medicare PPO | Admitting: Family Medicine

## 2013-06-27 ENCOUNTER — Encounter: Payer: Self-pay | Admitting: Family Medicine

## 2013-06-27 VITALS — BP 114/68 | HR 63 | Temp 98.1°F | Ht 67.5 in | Wt 155.1 lb

## 2013-06-27 DIAGNOSIS — E7800 Pure hypercholesterolemia, unspecified: Secondary | ICD-10-CM

## 2013-06-27 DIAGNOSIS — M25519 Pain in unspecified shoulder: Secondary | ICD-10-CM

## 2013-06-27 DIAGNOSIS — Z23 Encounter for immunization: Secondary | ICD-10-CM

## 2013-06-29 NOTE — Assessment & Plan Note (Signed)
Well controled on current meds 

## 2013-06-29 NOTE — Assessment & Plan Note (Signed)
Improving.

## 2013-06-29 NOTE — Progress Notes (Signed)
   Subjective:    Patient ID: John Chung, male    DOB: 02-01-1946, 68 y.o.   MRN: [id number]  HPI Follow up multiple issues Left shoulder improving with PT Recent cholesterol check at goal.  Tolerating statin well Due for colonoscopy - already has an appointment. Will give prevnar 13.    Review of Systems     Objective:   Physical Exam Left shoulder FROM with only mild tenderness. Cardiac RRR without m or g Lungs clear.        Assessment & Plan:

## 2013-07-11 ENCOUNTER — Ambulatory Visit (AMBULATORY_SURGERY_CENTER): Payer: Self-pay

## 2013-07-11 VITALS — Ht 68.0 in | Wt 153.0 lb

## 2013-07-11 DIAGNOSIS — Z1211 Encounter for screening for malignant neoplasm of colon: Secondary | ICD-10-CM

## 2013-07-11 MED ORDER — MOVIPREP 100 G PO SOLR: 1.0000 | Freq: Once | ORAL | Status: DC

## 2013-07-11 NOTE — Progress Notes (Signed)
No allergies to eggs or soy. No diet/weight loss meds. No home oxygen. Has email.  Emmi instructions given for colonoscopy.

## 2013-07-25 ENCOUNTER — Ambulatory Visit (AMBULATORY_SURGERY_CENTER): Payer: Medicare PPO | Admitting: Internal Medicine

## 2013-07-25 ENCOUNTER — Encounter: Payer: Self-pay | Admitting: Internal Medicine

## 2013-07-25 VITALS — BP 109/70 | HR 52 | Temp 95.7°F | Resp 17 | Ht 68.0 in | Wt 153.0 lb

## 2013-07-25 DIAGNOSIS — Z1211 Encounter for screening for malignant neoplasm of colon: Secondary | ICD-10-CM

## 2013-07-25 MED ORDER — SODIUM CHLORIDE 0.9 % IV SOLN: 500.0000 mL | INTRAVENOUS | Status: DC

## 2013-07-25 NOTE — Op Note (Signed)
[place]  [address]. [place], [address]   COLONOSCOPY PROCEDURE REPORT  PATIENT: John Chung, John Chung  MR#: [id number] BIRTHDATE: [birth year] , 54  yrs. old GENDER: Male ENDOSCOPIST: Lafayette Dragon, MD REFERRED PQ:[id number] Andria Frames, M.D. PROCEDURE DATE:  07/25/2013 PROCEDURE:   Colonoscopy, screening First Screening Colonoscopy - Avg.  risk and is 50 yrs.  old or older - No.  Prior Negative Screening - Now for repeat screening. 10 or more years since last screening  History of Adenoma - Now for follow-up colonoscopy & has been > or = to 3 yrs.  N/A  Polyps Removed Today? No.  Recommend repeat exam, <10 yrs? No. ASA CLASS:   Class II INDICATIONS:Average risk patient for colon cancer and last colonoscopy in February 2005 was a normal exam.  History of bleeding duodenal ulcer in 1995, treated for H.pylori. MEDICATIONS: MAC sedation, administered by CRNA and Propofol (Diprivan) 270 mg IV  DESCRIPTION OF PROCEDURE:   After the risks benefits and alternatives of the procedure were thoroughly explained, informed consent was obtained.  A digital rectal exam revealed no abnormalities of the rectum.   The LB [id number]  endoscope was introduced through the anus and advanced to the cecum, which was identified by both the appendix and ileocecal valve. No adverse events experienced.   The quality of the prep was good, using MoviPrep  The instrument was then slowly withdrawn as the colon was fully examined.      COLON FINDINGS: A normal appearing cecum, ileocecal valve, and appendiceal orifice were identified.  The ascending, hepatic flexure, transverse, splenic flexure, descending, sigmoid colon and rectum appeared unremarkable.  No polyps or cancers were seen. Retroflexed views revealed no abnormalities. The time to cecum=7 minutes 10 seconds.  Withdrawal time=8 minutes 03 seconds.  The scope was withdrawn and the procedure completed. COMPLICATIONS: There were no  complications.  ENDOSCOPIC IMPRESSION: Normal colon  RECOMMENDATIONS: high fiber diet Recall colonoscopy in 10 years   eSigned:  Lafayette Dragon, MD 07/25/2013 8:25 AM   cc:

## 2013-07-25 NOTE — Patient Instructions (Signed)
YOU HAD AN ENDOSCOPIC PROCEDURE TODAY AT THE [facility]: Refer to the procedure report that was given to you for any specific questions about what was found during the examination.  If the procedure report does not answer your questions, please call your gastroenterologist to clarify.  If you requested that your care partner not be given the details of your procedure findings, then the procedure report has been included in a sealed envelope for you to review at your convenience later.  YOU SHOULD EXPECT: Some feelings of bloating in the abdomen. Passage of more gas than usual.  Walking can help get rid of the air that was put into your GI tract during the procedure and reduce the bloating. If you had a lower endoscopy (such as a colonoscopy or flexible sigmoidoscopy) you may notice spotting of blood in your stool or on the toilet paper. If you underwent a bowel prep for your procedure, then you may not have a normal bowel movement for a few days.  DIET: Your first meal following the procedure should be a light meal and then it is ok to progress to your normal diet.  A half-sandwich or bowl of soup is an example of a good first meal.  Heavy or fried foods are harder to digest and may make you feel nauseous or bloated.  Likewise meals heavy in dairy and vegetables can cause extra gas to form and this can also increase the bloating.  Drink plenty of fluids but you should avoid alcoholic beverages for 24 hours.  ACTIVITY: Your care partner should take you home directly after the procedure.  You should plan to take it easy, moving slowly for the rest of the day.  You can resume normal activity the day after the procedure however you should NOT DRIVE or use heavy machinery for 24 hours (because of the sedation medicines used during the test).    SYMPTOMS TO REPORT IMMEDIATELY: A gastroenterologist can be reached at any hour.  During normal business hours, 8:30 AM to 5:00 PM Monday through Friday,  call [phone].  After hours and on weekends, please call the GI answering service at [phone] who will take a message and have the physician on call contact you.   Following lower endoscopy (colonoscopy or flexible sigmoidoscopy):  Excessive amounts of blood in the stool  Significant tenderness or worsening of abdominal pains  Swelling of the abdomen that is new, acute  Fever of 100F or higher    FOLLOW UP: If any biopsies were taken you will be contacted by phone or by letter within the next 1-3 weeks.  Call your gastroenterologist if you have not heard about the biopsies in 3 weeks.  Our staff will call the home number listed on your records the next business day following your procedure to check on you and address any questions or concerns that you may have at that time regarding the information given to you following your procedure. This is a courtesy call and so if there is no answer at the home number and we have not heard from you through the emergency physician on call, we will assume that you have returned to your regular daily activities without incident.  SIGNATURES/CONFIDENTIALITY: You and/or your care partner have signed paperwork which will be entered into your electronic medical record.  These signatures attest to the fact that that the information above on your After Visit Summary has been reviewed and is understood.  Full responsibility of the confidentiality   of this discharge information lies with you and/or your care-partner.   High fiber diet.  Repeat colonoscopy in 10 years.

## 2013-07-25 NOTE — Progress Notes (Signed)
Report to pacu rn, vss, bbs=clear 

## 2013-07-26 ENCOUNTER — Telehealth: Payer: Self-pay | Admitting: *Deleted

## 2013-07-26 NOTE — Telephone Encounter (Signed)
  Follow up Call-  Call back number 07/25/2013  Post procedure Call Back phone  # [phone]  Permission to leave phone message Yes     Patient questions:  Do you have a fever, pain , or abdominal swelling? no Pain Score  0 *  Have you tolerated food without any problems? yes  Have you been able to return to your normal activities? yes  Do you have any questions about your discharge instructions: Diet   no Medications  no Follow up visit  no  Do you have questions or concerns about your Care? no  Actions: * If pain score is 4 or above: No action needed, pain <4.

## 2013-09-20 ENCOUNTER — Ambulatory Visit (INDEPENDENT_AMBULATORY_CARE_PROVIDER_SITE_OTHER): Payer: Medicare HMO | Admitting: Ophthalmology

## 2013-09-24 ENCOUNTER — Ambulatory Visit (INDEPENDENT_AMBULATORY_CARE_PROVIDER_SITE_OTHER): Payer: Medicare HMO | Admitting: Ophthalmology

## 2013-09-24 DIAGNOSIS — H2510 Age-related nuclear cataract, unspecified eye: Secondary | ICD-10-CM

## 2013-09-24 DIAGNOSIS — H43819 Vitreous degeneration, unspecified eye: Secondary | ICD-10-CM

## 2013-09-24 DIAGNOSIS — H35379 Puckering of macula, unspecified eye: Secondary | ICD-10-CM

## 2013-09-24 DIAGNOSIS — H3530 Unspecified macular degeneration: Secondary | ICD-10-CM

## 2014-04-01 ENCOUNTER — Telehealth: Payer: Self-pay | Admitting: Family Medicine

## 2014-04-01 ENCOUNTER — Other Ambulatory Visit: Payer: Self-pay | Admitting: Family Medicine

## 2014-04-01 DIAGNOSIS — E7800 Pure hypercholesterolemia, unspecified: Secondary | ICD-10-CM

## 2014-04-01 NOTE — Telephone Encounter (Signed)
I put in future orders.  Because insurers pay for only one lipid panel per year, tell John Chung to not have has blood drawn until after 05/31/14 (last lipid panel was 05/30/13).

## 2014-04-01 NOTE — Assessment & Plan Note (Signed)
E refill

## 2014-04-01 NOTE — Telephone Encounter (Signed)
Pt called and would like Dr. Andria Frames to put in orders for him to have blood work done. Can we call him when the orders are in. jw

## 2014-04-02 NOTE — Telephone Encounter (Signed)
LVM for patient to call back to inform him of below

## 2014-04-02 NOTE — Telephone Encounter (Signed)
Spoke with patient and informed him of below 

## 2014-06-26 ENCOUNTER — Encounter: Payer: Medicare PPO | Admitting: Family Medicine

## 2014-06-28 ENCOUNTER — Encounter: Payer: Medicare PPO | Admitting: Family Medicine

## 2014-08-16 ENCOUNTER — Encounter: Payer: Self-pay | Admitting: Family Medicine

## 2014-08-16 ENCOUNTER — Ambulatory Visit (INDEPENDENT_AMBULATORY_CARE_PROVIDER_SITE_OTHER): Payer: Medicare PPO | Admitting: Family Medicine

## 2014-08-16 VITALS — BP 116/66 | HR 56 | Temp 97.4°F | Ht 68.0 in | Wt 160.4 lb

## 2014-08-16 DIAGNOSIS — H3530 Unspecified macular degeneration: Secondary | ICD-10-CM | POA: Diagnosis not present

## 2014-08-16 DIAGNOSIS — Z0000 Encounter for general adult medical examination without abnormal findings: Secondary | ICD-10-CM

## 2014-08-16 DIAGNOSIS — E7800 Pure hypercholesterolemia, unspecified: Secondary | ICD-10-CM

## 2014-08-16 LAB — LIPID PANEL
CHOL/HDL RATIO: 2.6 ratio
Cholesterol: 145 mg/dL (ref 0–200)
HDL: 55 mg/dL (ref >=40)
LDL CALC: 80 mg/dL (ref 0–99)
TRIGLYCERIDES: 51 mg/dL (ref <150)
VLDL: 10 mg/dL (ref 0–40)

## 2014-08-16 LAB — COMPREHENSIVE METABOLIC PANEL
ALK PHOS: 72 U/L (ref 39–117)
ALT: 22 U/L (ref 0–53)
AST: 20 U/L (ref 0–37)
Albumin: 4.1 g/dL (ref 3.5–5.2)
BILIRUBIN TOTAL: 0.5 mg/dL (ref 0.2–1.2)
BUN: 15 mg/dL (ref 6–23)
CALCIUM: 9.1 mg/dL (ref 8.4–10.5)
CHLORIDE: 103 meq/L (ref 96–112)
CO2: 27 meq/L (ref 19–32)
Creat: 0.83 mg/dL (ref 0.50–1.35)
Glucose, Bld: 85 mg/dL (ref 70–99)
POTASSIUM: 4.3 meq/L (ref 3.5–5.3)
Sodium: 139 mEq/L (ref 135–145)
Total Protein: 6.9 g/dL (ref 6.0–8.3)

## 2014-08-16 NOTE — Patient Instructions (Signed)
You are very healthy Keep up the good work.   I will call with blood work results.  I only need to see you once a year.   Say hi to [facility]

## 2014-08-16 NOTE — Progress Notes (Signed)
   Subjective:    Patient ID: John Chung, male    DOB: [birth year], 69 y.o.   MRN: [id number]  HPI I personally was present and/or repeated all elements of the H and PE documented by MS 3 [place].   Briefly, extraordinarily healthy 69 year old with no worrisome health habits.  Has positive health habits of physical exercise, mental health exercise (regular meditation), healthy diet.  Enjoying the "golden years" with his wife Erline Levine.  Just go back from a one month [place] Cruise.    No complaints. Here for annual lipids.    Review of Systems ROS 12 point normal     Objective:   Physical Exam HEENT normal Neck supple  Lungs clear Cardiac rrr without m or g Abd benign Ext WNL Neuro WNL        Assessment & Plan:

## 2014-08-16 NOTE — Progress Notes (Signed)
Subjective:     Patient ID: IZAYA NETHERTON, male   DOB: December 14, 1945, 69 y.o.   MRN: [id number] Written by: Dois Davenport, MS3 HPI Mr. Blahnik is a 69 year old male with a past medical history significant for hypercholesterolemia who presents today for an annual physical.   He is currently taking simvastatin and denies symptoms of myopathy. He also takes aspirin and a vitamin/antioxidant formulation for his macular degeneration, which is followed annually by ophthalmology. He reports no vision changes today.   He has a history of actinic keratosis, but reports no concerning or new skin lesions today.   Social History: He runs, bikes, and does yoga daily. While exercising, he reports a max heart rate of 120 bpm. He rarely drinks alcohol. He eats a balanced diet. He is a former smoker, quit in 1979.   Health Maintenance: currently up to date on all preventive screening, including Hepatitis C (2015), AAA screen (2013), and colonoscopy (2015). He has received both pneumonia vaccinations.   Review of Systems See HPI, all other systems normal.     Objective:   Physical Exam BP 116/66 mmHg  Pulse 56  Temp(Src) 97.4 F (36.3 C) (Oral)  Ht 5\' 8"  (1.727 m)  Wt 160 lb 6.4 oz (72.757 kg)  BMI 24.39 kg/m2 Gen: well-appearing male in no acute distress HEENT: EOMI, PERRLA, no cervical or submandibular LAD CV: RRR, no MRG, nl S1 and S2, no lower extremity edema Pulm: lungs CTA bilaterally, no crackles or wheezes    Assessment:     Please see Problem List.     Plan:     Please see Problem List.

## 2014-08-16 NOTE — Assessment & Plan Note (Addendum)
Annual follow up [place] labs.

## 2014-08-16 NOTE — Assessment & Plan Note (Signed)
Currently taking a vitamin/antioxidant formulation. He is followed annually by ophthalmology. Patient denies vision changes.   Dois Davenport, MS3

## 2014-09-30 ENCOUNTER — Ambulatory Visit (INDEPENDENT_AMBULATORY_CARE_PROVIDER_SITE_OTHER): Payer: Medicare HMO | Admitting: Ophthalmology

## 2014-09-30 DIAGNOSIS — H35372 Puckering of macula, left eye: Secondary | ICD-10-CM

## 2014-09-30 DIAGNOSIS — H3531 Nonexudative age-related macular degeneration: Secondary | ICD-10-CM

## 2014-09-30 DIAGNOSIS — H43813 Vitreous degeneration, bilateral: Secondary | ICD-10-CM | POA: Diagnosis not present

## 2015-03-31 ENCOUNTER — Ambulatory Visit (INDEPENDENT_AMBULATORY_CARE_PROVIDER_SITE_OTHER): Payer: Medicare Other | Admitting: Family Medicine

## 2015-03-31 VITALS — BP 136/75 | HR 53 | Temp 97.5°F | Ht 68.0 in | Wt 160.6 lb

## 2015-03-31 DIAGNOSIS — L989 Disorder of the skin and subcutaneous tissue, unspecified: Secondary | ICD-10-CM

## 2015-03-31 DIAGNOSIS — L570 Actinic keratosis: Secondary | ICD-10-CM | POA: Diagnosis not present

## 2015-03-31 NOTE — Progress Notes (Signed)
Date of Visit: 03/31/2015   HPI:  Patient presents to discuss lesion on R forearm. Has been present for about 1 month. Does not think it is changing. Has had actinic keratoses and seborrheic keratoses frozen off here in our clinic before.  He also has a lesion on his R face that's been present for a while.    ROS: See HPI.  [place]: history of actinic keratosis, hyperlipidemia, trigger finger, macular degeneration  PHYSICAL EXAM: BP 136/75 mmHg  Pulse 53  Temp(Src) 97.5 F (36.4 C) (Oral)  Ht 5\' 8"  (1.727 m)  Wt 160 lb 9.6 oz (72.848 kg)  BMI 24.42 kg/m2 Gen: NAD, pleasant, cooperative Skin: R forearm with ~1cm scaling yellow thick lesion on extensor surface. No bleeding or drainage. Mild surrounding erythema of skin. Just distal to the lesion there is a separate area of mild scaling consistent with an AK.  R face with seborrheic keratosis, not inflamed.      ASSESSMENT/PLAN:  ACTINIC KERATOSIS Lesion today may represent AK, but given relatively large thickness and presence for only 1 month will refer to dermatology for further evaluation. May benefit from skin biopsy versus just proceeding with cryotherapy. Referral entered.   Seborrheic keratosis - not bothering him so no treatment warranted. Advised he can show it to the dermatologist during that appointment.   FOLLOW UP: Referral to dermatology.  [place]. Ardelia Mems, [place]

## 2015-03-31 NOTE — Patient Instructions (Signed)
Referring you to a dermatologist for the spot on your arm Someone will call you with an appointment.  Be well, Dr. Ardelia Mems

## 2015-03-31 NOTE — Assessment & Plan Note (Signed)
Lesion today may represent AK, but given relatively large thickness and presence for only 1 month will refer to dermatology for further evaluation. May benefit from skin biopsy versus just proceeding with cryotherapy. Referral entered.

## 2015-04-07 ENCOUNTER — Other Ambulatory Visit: Payer: Self-pay | Admitting: Family Medicine

## 2015-05-01 ENCOUNTER — Telehealth: Payer: Self-pay | Admitting: *Deleted

## 2015-05-01 NOTE — Telephone Encounter (Signed)
Called patient to offer flu vaccine, however, VM picked up.  Left message on patient's voice mail to return call. DUCATTE, LAURENZE L, RN   

## 2015-09-05 ENCOUNTER — Other Ambulatory Visit: Payer: Self-pay | Admitting: Family Medicine

## 2015-09-05 MED ORDER — DOXYCYCLINE HYCLATE 100 MG PO TABS: 100.0000 mg | ORAL_TABLET | Freq: Two times a day (BID) | ORAL | Status: DC

## 2015-09-05 NOTE — Progress Notes (Signed)
Rx sent to [facility] on [address].  Only take antibiotics if getting worse  Gwyndolyn Saxon A. Andria Frames, MD Director, [place] [facility] Direct Office # [phone] Page # [phone] Mobile # [phone]  -----Original Message----- From: [email or web address] [[email or web address]  Sent: Friday, September 05, 2015 9:00 AM To: Daeveon Zweber, Bill @[email or web address]> Subject: And  Ok, I'm thinking that we need to watch if it gets bigger or gets smaller.  And, John Chung has not had any flu like symptoms (did I say that?).  So if it gets smaller, we're ok but if it gets bigger, we're not.  We're leaving Tuesday early to drive to [place] for his sister's memorial.  I think I'd like a prescription for whatever the antibiotic is (I can't remember), in case.  Unless you think that's not right.  Ugh.  John Chung   It does not look like the target lesion of Lyme disease.  It is hard to tell from the picture if it is cellulitis (bacterial infection of the skin that needs antibiotics) or the a large hive reaction (formal name urticaria, typically treated with cold compresses and antihistamines such as Claritin.)  With cellulitis, you typically have fever and you almost always have pain, not just itching.  For now, ice, antihistamine and keep an eye on it.  Antibiotics if pain, fever or spreading.  Hung Rhinesmith A. Andria Frames, MD Director, [place] [facility] Direct Office # [phone] Page # [phone] Mobile # [phone]  From: Marla Roe [[email or web address]]  Sent: Friday, September 05, 2015 7:06 AM To: Shalom Mcguiness, Bill @[email or web address]> Subject: [name]'s insect bite  Does this look anything like a Lyme disease bite?  He thinks it's a spider bite and he doesn't have any of the discomfort I had when I had Lyme disease.  We're in [place] and can get antibiotics if you think we should.  Ugh.  Thanks for any help. John Chung and John Chung

## 2015-10-06 ENCOUNTER — Ambulatory Visit (INDEPENDENT_AMBULATORY_CARE_PROVIDER_SITE_OTHER): Payer: Medicare Other | Admitting: Ophthalmology

## 2015-10-06 DIAGNOSIS — H35371 Puckering of macula, right eye: Secondary | ICD-10-CM

## 2015-10-06 DIAGNOSIS — H2513 Age-related nuclear cataract, bilateral: Secondary | ICD-10-CM

## 2015-10-06 DIAGNOSIS — H353132 Nonexudative age-related macular degeneration, bilateral, intermediate dry stage: Secondary | ICD-10-CM | POA: Diagnosis not present

## 2015-10-06 DIAGNOSIS — H43813 Vitreous degeneration, bilateral: Secondary | ICD-10-CM | POA: Diagnosis not present

## 2016-02-19 ENCOUNTER — Encounter: Payer: Self-pay | Admitting: Family Medicine

## 2016-02-19 ENCOUNTER — Ambulatory Visit (INDEPENDENT_AMBULATORY_CARE_PROVIDER_SITE_OTHER): Payer: Medicare Other | Admitting: Family Medicine

## 2016-02-19 DIAGNOSIS — J069 Acute upper respiratory infection, unspecified: Secondary | ICD-10-CM | POA: Diagnosis not present

## 2016-02-19 DIAGNOSIS — H6123 Impacted cerumen, bilateral: Secondary | ICD-10-CM

## 2016-02-19 DIAGNOSIS — H6120 Impacted cerumen, unspecified ear: Secondary | ICD-10-CM | POA: Insufficient documentation

## 2016-02-19 NOTE — Progress Notes (Signed)
   Subjective:    Patient ID: John Chung, male    DOB: Apr 09, 1945, 70 y.o.   MRN: [id number]  HPI Cough x one month.  Carefully parsing the history, it seems that he had a mild URI one month ago with nasal congestion and a mild cough due to post nasal drip.  Then had a trip and likely was exposed to a second infection.  Now with 4 days of cough, nasal congestion and fullness of ears.  No fever or SOB.  Cough is non productive.  No vomiting or GI upset    Review of Systems     Objective:   Physical Exam Bilateral cerumen impaction.  Irrigated clear with normal TMs.  Ear fullness resolved.   Throat mild injection Neck, no sig nodes. Lungs, clear         Assessment & Plan:

## 2016-02-19 NOTE — Assessment & Plan Note (Signed)
Likely back to back URIs.  Should be viral and self limited.

## 2016-02-19 NOTE — Assessment & Plan Note (Signed)
Irrigated clear 

## 2016-02-19 NOTE — Patient Instructions (Signed)
Call me Monday if you are not feeling better.  I think that you have had back to back viral infections and I doubt that antibiotics will help.  If you are still sick on Monday, I might change my mind and treat with antibiotics.

## 2016-04-03 ENCOUNTER — Telehealth: Payer: Self-pay | Admitting: Family Medicine

## 2016-04-03 DIAGNOSIS — R05 Cough: Secondary | ICD-10-CM | POA: Insufficient documentation

## 2016-04-03 DIAGNOSIS — R059 Cough, unspecified: Secondary | ICD-10-CM | POA: Insufficient documentation

## 2016-04-03 NOTE — Telephone Encounter (Signed)
John Chung, I reviewed my notes.  You were seen on 02/19/16 - 6 weeks ago.  And you had the cough for one month at that point.  Although you are at very low risk for anything bad, it is time to at least get a chest x ray.  I put in the order.  You can walk into the [facility] facility any weekday for the x ray.  Call the office Monday if you have questions.  Cathlyn Tersigni A. Andria Frames, MD Director, [place] [facility] Direct Office # [phone] Page # [phone] Mobile # [phone]  -----Original Message----- From: [name] [[email or web address]]  Sent: Friday, April 02, 2016 5:50 PM To: [name], [name] @ [email or web address]> Subject: [External Email]coughing wheezing  *Caution - External Email*  hi Dr 02-26-1985 i was in to see you about a month ago about a cough. it went away but is back. I've had it for about a week. i am taking an expectorant and has loosened up the cough so I'm getting mucus out. it seems if i take ibuprofen it calms down. do i need to see you, or is it just a cold?  thank you, jim

## 2016-04-05 ENCOUNTER — Ambulatory Visit
Admission: RE | Admit: 2016-04-05 | Discharge: 2016-04-05 | Disposition: A | Discharge status: Home / Self Care | Payer: Medicare Other | Source: Ambulatory Visit | Attending: Family Medicine | Admitting: Family Medicine

## 2016-04-05 DIAGNOSIS — R059 Cough, unspecified: Secondary | ICD-10-CM

## 2016-04-05 DIAGNOSIS — R05 Cough: Secondary | ICD-10-CM

## 2016-04-14 ENCOUNTER — Encounter: Payer: Self-pay | Admitting: Family Medicine

## 2016-04-14 ENCOUNTER — Ambulatory Visit (INDEPENDENT_AMBULATORY_CARE_PROVIDER_SITE_OTHER): Payer: Medicare Other | Admitting: Family Medicine

## 2016-04-14 ENCOUNTER — Other Ambulatory Visit: Payer: Self-pay | Admitting: Family Medicine

## 2016-04-14 DIAGNOSIS — E7800 Pure hypercholesterolemia, unspecified: Secondary | ICD-10-CM | POA: Diagnosis not present

## 2016-04-14 DIAGNOSIS — Z0000 Encounter for general adult medical examination without abnormal findings: Secondary | ICD-10-CM | POA: Diagnosis not present

## 2016-04-14 MED ORDER — SIMVASTATIN 40 MG PO TABS: ORAL_TABLET | ORAL | 3 refills | Status: DC

## 2016-04-14 NOTE — Assessment & Plan Note (Signed)
Screening labs.  Cont simvastatin for primary prevention.

## 2016-04-14 NOTE — Patient Instructions (Signed)
You look great.   I will call with lab results. Keep up the good work.

## 2016-04-14 NOTE — Assessment & Plan Note (Signed)
No changes - Keep up the good work!!

## 2016-04-14 NOTE — Progress Notes (Signed)
   Subjective:    Patient ID: John Chung, male    DOB: Jan 18, 1946, 71 y.o.   MRN: [id number]  HPI  Annual physical for this very healthy 71 yo male.  He has no complaints.  Came because he is due.  Has expectation of blood work.  Issues: Hypercholesterolemia.  On simvastatin without complaints.  Due for lipids.  He is primary prevention. Macular degeneration: states stable per eye doc. Mild hearing loss: Functions well and does not feel needs hearing aids yet. HPDP Up to date on all recommended screening except lipid panel.  Discussed that PSA not indicated.  Also discussed that new shingles vaccine would come out soon. Diet: very heathy Exercise: great with Yoga + aerobic and resistent training.    Review of Systems No HA, diplopia, masses, skin changes, SOB, CP, change in bowel, bladder or appetite.  No wt change.       Objective:   Physical Exam VS note with great BP and BMI. HEENT normal. Neck without masses or thyromegally Lungs clear Cardiac RRR without m or g Abd benign. Ext WNL, no edema. Neuro, WNL        Assessment & Plan:

## 2016-04-19 ENCOUNTER — Encounter: Payer: Self-pay | Admitting: Family Medicine

## 2016-05-14 LAB — LIPID PANEL
CHOLESTEROL TOTAL: 169 mg/dL (ref 100–199)
Chol/HDL Ratio: 3.7 ratio units (ref 0.0–5.0)
HDL: 46 mg/dL (ref >39)
LDL Calculated: 108 mg/dL — ABNORMAL HIGH (ref 0–99)
TRIGLYCERIDES: 76 mg/dL (ref 0–149)
VLDL Cholesterol Cal: 15 mg/dL (ref 5–40)

## 2016-05-14 LAB — COMPREHENSIVE METABOLIC PANEL
A/G RATIO: 1.5 (ref 1.2–2.2)
ALK PHOS: 71 IU/L (ref 39–117)
ALT: 16 IU/L (ref 0–44)
AST: 19 IU/L (ref 0–40)
Albumin: 4.3 g/dL (ref 3.5–4.8)
BILIRUBIN TOTAL: 0.3 mg/dL (ref 0.0–1.2)
BUN / CREAT RATIO: 17 (ref 10–24)
BUN: 15 mg/dL (ref 8–27)
CHLORIDE: 101 mmol/L (ref 96–106)
CO2: 27 mmol/L (ref 18–29)
Calcium: 9.4 mg/dL (ref 8.6–10.2)
Creatinine, Ser: 0.88 mg/dL (ref 0.76–1.27)
GFR calc Af Amer: 100 mL/min/{1.73_m2} (ref >59)
GFR calc non Af Amer: 86 mL/min/{1.73_m2} (ref >59)
GLOBULIN, TOTAL: 2.9 g/dL (ref 1.5–4.5)
Glucose: 84 mg/dL (ref 65–99)
POTASSIUM: 4.8 mmol/L (ref 3.5–5.2)
SODIUM: 141 mmol/L (ref 134–144)
Total Protein: 7.2 g/dL (ref 6.0–8.5)

## 2016-05-14 LAB — CBC
Hematocrit: 45.6 % (ref 37.5–51.0)
Hemoglobin: 15 g/dL (ref 13.0–17.7)
MCH: 29.1 pg (ref 26.6–33.0)
MCHC: 32.9 g/dL (ref 31.5–35.7)
MCV: 88 fL (ref 79–97)
PLATELETS: 259 10*3/uL (ref 150–379)
RBC: 5.16 x10E6/uL (ref 4.14–5.80)
RDW: 13.6 % (ref 12.3–15.4)
WBC: 9.7 10*3/uL (ref 3.4–10.8)

## 2016-05-14 LAB — PLEASE NOTE

## 2016-06-29 ENCOUNTER — Encounter: Payer: Self-pay | Admitting: Obstetrics and Gynecology

## 2016-06-29 ENCOUNTER — Ambulatory Visit (INDEPENDENT_AMBULATORY_CARE_PROVIDER_SITE_OTHER): Payer: Medicare Other | Admitting: Obstetrics and Gynecology

## 2016-06-29 VITALS — BP 114/60 | HR 66 | Temp 98.1°F | Wt 163.8 lb

## 2016-06-29 DIAGNOSIS — R05 Cough: Secondary | ICD-10-CM | POA: Diagnosis not present

## 2016-06-29 DIAGNOSIS — R059 Cough, unspecified: Secondary | ICD-10-CM

## 2016-06-29 MED ORDER — CETIRIZINE HCL 10 MG PO TABS: 10.0000 mg | ORAL_TABLET | Freq: Every day | ORAL | 0 refills | Status: DC

## 2016-06-29 NOTE — Progress Notes (Signed)
   Subjective:   Patient ID: John Chung, male    DOB: 1945-12-10, 71 y.o.   MRN: [id number]  Patient presents for Same Day Appointment  Chief Complaint  Patient presents with  . Cough    since end of november    HPI: # Cough Has been coughing for 5 months Cough is: dry and intermittent Worsened by food; will get cough either before or after eating States he feels like something is stuck in throat and itching it Has no known seasonal or drug allergies Swallowing without difficulty Medications tried: no Taking blood pressure medications: no Following up after PCP suggestion.  Had CXR No lung disease  Symptoms Runny nose: no Mucous in back of throat: yes Throat burning or reflux: no Wheezing or asthma: no Fever: no Chest Pain: no Shortness of breath: no  Review of Systems   See HPI for ROS.   History  Smoking Status  . Former Smoker  Smokeless Tobacco  . Former Systems developer  . Quit date: 03/30/1981    Past medical history, surgical, family, and social history reviewed and updated in the EMR as appropriate.  Pertinent Historical Findings include: None Objective:  BP 114/60   Pulse 66   Temp 98.1 F (36.7 C) (Oral)   Wt 163 lb 12.8 oz (74.3 kg)   SpO2 94%   BMI 25.28 kg/m  Vitals and nursing note reviewed  Physical Exam  Constitutional: He is well-developed, well-nourished, and in no distress.  HENT:  Mouth/Throat: Oropharynx is clear and moist.  Eyes: Conjunctivae and EOM are normal.  Cardiovascular: Normal rate, regular rhythm and normal heart sounds.   Pulmonary/Chest: Effort normal and breath sounds normal. He has no wheezes. He has no rales.   Assessment & Plan:  1. Cough Unknown etiology of cough at this time. Most likely a functional cough. No red flags. No concern for reflux. Patient does not have lung disease. Former smoker. No concern for infectious process. Chest x-ray reviewed which is benign. May have component of allergies. Rx given for Zyrtec  to use for 2 weeks to see if this helps resolve symptoms. Follow-up with PCP for further work-up. Reassurance given.  Diagnosis and plan along with any newly prescribed medication(s) were discussed in detail with this patient today. The patient verbalized understanding and agreed with the plan.   PATIENT EDUCATION PROVIDED: See AVS   Luiz Blare, DO 06/29/2016, 2:02 PM PGY-3, [place]

## 2016-06-29 NOTE — Patient Instructions (Signed)
Cough, Adult  A cough helps to clear your throat and lungs. A cough may last only 2?3 weeks (acute), or it may last longer than 8 weeks (chronic). Many different things can cause a cough. A cough may be a sign of an illness or another medical condition.  Follow these instructions at home:  ? Pay attention to any changes in your cough.  ? Take medicines only as told by your doctor.  ? If you were prescribed an antibiotic medicine, take it as told by your doctor. Do not stop taking it even if you start to feel better.  ? Talk with your doctor before you try using a cough medicine.  ? Drink enough fluid to keep your pee (urine) clear or pale yellow.  ? If the air is dry, use a cold steam vaporizer or humidifier in your home.  ? Stay away from things that make you cough at work or at home.  ? If your cough is worse at night, try using extra pillows to raise your head up higher while you sleep.  ? Do not smoke, and try not to be around smoke. If you need help quitting, ask your doctor.  ? Do not have caffeine.  ? Do not drink alcohol.  ? Rest as needed.  Contact a doctor if:  ? You have new problems (symptoms).  ? You cough up yellow fluid (pus).  ? Your cough does not get better after 2?3 weeks, or your cough gets worse.  ? Medicine does not help your cough and you are not sleeping well.  ? You have pain that gets worse or pain that is not helped with medicine.  ? You have a fever.  ? You are losing weight and you do not know why.  ? You have night sweats.  Get help right away if:  ? You cough up blood.  ? You have trouble breathing.  ? Your heartbeat is very fast.  This information is not intended to replace advice given to you by your health care provider. Make sure you discuss any questions you have with your health care provider.  Document Released: 11/12/2010 Document Revised: 08/07/2015 Document Reviewed: 05/08/2014  Elsevier Interactive Patient Education ? 2017 Elsevier Inc.

## 2016-07-21 ENCOUNTER — Encounter: Payer: Self-pay | Admitting: Family Medicine

## 2016-07-21 ENCOUNTER — Ambulatory Visit (INDEPENDENT_AMBULATORY_CARE_PROVIDER_SITE_OTHER): Payer: Medicare Other | Admitting: Family Medicine

## 2016-07-21 DIAGNOSIS — R05 Cough: Secondary | ICD-10-CM

## 2016-07-21 DIAGNOSIS — R059 Cough, unspecified: Secondary | ICD-10-CM

## 2016-07-21 NOTE — Patient Instructions (Addendum)
Use the over the counter Flonase/fluticasone regularly, ever day for two weeks.   If still cough, call me and I will put in an ENT referral. The book I mentioned is Attending.

## 2016-07-22 NOTE — Progress Notes (Signed)
   Subjective:    Patient ID: John Chung, male    DOB: August 15, 1945, 71 y.o.   MRN: [id number]  HPI Persistent cough:  Present since Dec.  Per his guess, it has not been a single problem: "I think I have had four colds."  In the past, has not had significant springtime allergies.  Symptoms are mainly rhinitis and cough.  Again, per his best guess, cough is more postnasal drip rather than from the chest.  Seen on 12/7, 1/31 and 4/17.  Had normal CXR on 1/22.  Denies hemoptosis, chest pain, DOE or fever.  Generally feeling well: no wt loss or constitutional sx.  Has a hx of recurrent cerumen impactions.  Admits to not taking flonase regularly.  Never smoker    Review of Systems     Objective:   Physical Exam Bilateral cerumen impactions, left worse than right.  Both irrigated clear.  The right expecially was rubbing against the TM. Nose, boggy mucosa Throat normal Neck, no nodes. Lungs clear       Assessment & Plan:

## 2016-07-22 NOTE — Assessment & Plan Note (Signed)
Doubt important pathology.  Bad allergy season may be contributing.  TM irritation may play a role.  Never smoker makes him at low risk for neoplasm. Regularly use flonase and see how cough responds to relief of cerumen impaction.  In two weeks if still symptomatic, will get ENT referral.

## 2017-05-09 ENCOUNTER — Other Ambulatory Visit: Payer: Self-pay | Admitting: Family Medicine

## 2017-05-09 DIAGNOSIS — E7800 Pure hypercholesterolemia, unspecified: Secondary | ICD-10-CM

## 2017-08-11 ENCOUNTER — Other Ambulatory Visit: Payer: Medicare Other

## 2017-08-25 ENCOUNTER — Encounter: Payer: Medicare Other | Admitting: Family Medicine

## 2017-08-31 ENCOUNTER — Other Ambulatory Visit: Payer: Self-pay

## 2017-08-31 ENCOUNTER — Encounter: Payer: Self-pay | Admitting: Family Medicine

## 2017-08-31 ENCOUNTER — Ambulatory Visit (INDEPENDENT_AMBULATORY_CARE_PROVIDER_SITE_OTHER): Payer: Medicare Other | Admitting: Family Medicine

## 2017-08-31 DIAGNOSIS — Z0000 Encounter for general adult medical examination without abnormal findings: Secondary | ICD-10-CM | POA: Diagnosis not present

## 2017-08-31 DIAGNOSIS — E7800 Pure hypercholesterolemia, unspecified: Secondary | ICD-10-CM

## 2017-08-31 NOTE — Progress Notes (Signed)
   Subjective:    Patient ID: John Chung, male    DOB: 29-Jun-1945, 72 y.o.   MRN: [id number]  HPI No complaints.  Annual physical.   Very healthy.  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  Generally healthy diet.  BMI just under 25.  No at risk behaviors.  [place].  Good social group.  Minimal TV.  No chronic problems except: Hypercholesterolemia.  Primary prevention.  Was off statin with last year LDL.  Now taking simvastatin.  Also ASA.    Up to date on recommended screening tests/immunizations.     Review of Systems No headache, blurred or double vision, skin changes, SOB, CP, palpitations, change in appetite, bowel or bladder.  No bleeding.  No focal neuro changes.     Objective:   Physical Exam VS noted HEENT WNL Neck supple without masses Lungs clear Cardiac RRR without m or g abd benign Ext no edema Neuro, normal gait.  Motor and sensory grossly intact.        Assessment & Plan:

## 2017-08-31 NOTE — Patient Instructions (Signed)
You look great.  No bad habits.  Keep up the good work.

## 2017-09-01 ENCOUNTER — Encounter: Payer: Self-pay | Admitting: Family Medicine

## 2017-09-01 LAB — CBC
Hematocrit: 43.8 % (ref 37.5–51.0)
Hemoglobin: 14.8 g/dL (ref 13.0–17.7)
MCH: 30.3 pg (ref 26.6–33.0)
MCHC: 33.8 g/dL (ref 31.5–35.7)
MCV: 90 fL (ref 79–97)
PLATELETS: 212 10*3/uL (ref 150–450)
RBC: 4.89 x10E6/uL (ref 4.14–5.80)
RDW: 12.9 % (ref 12.3–15.4)
WBC: 7.6 10*3/uL (ref 3.4–10.8)

## 2017-09-01 LAB — CMP14+EGFR
A/G RATIO: 1.7 (ref 1.2–2.2)
ALT: 21 IU/L (ref 0–44)
AST: 24 IU/L (ref 0–40)
Albumin: 4.5 g/dL (ref 3.5–4.8)
Alkaline Phosphatase: 76 IU/L (ref 39–117)
BUN/Creatinine Ratio: 13 (ref 10–24)
BUN: 12 mg/dL (ref 8–27)
Bilirubin Total: 0.3 mg/dL (ref 0.0–1.2)
CALCIUM: 9.3 mg/dL (ref 8.6–10.2)
CHLORIDE: 104 mmol/L (ref 96–106)
CO2: 25 mmol/L (ref 20–29)
CREATININE: 0.93 mg/dL (ref 0.76–1.27)
GFR, EST AFRICAN AMERICAN: 94 mL/min/{1.73_m2} (ref >59)
GFR, EST NON AFRICAN AMERICAN: 81 mL/min/{1.73_m2} (ref >59)
GLOBULIN, TOTAL: 2.7 g/dL (ref 1.5–4.5)
Glucose: 85 mg/dL (ref 65–99)
POTASSIUM: 4.6 mmol/L (ref 3.5–5.2)
SODIUM: 144 mmol/L (ref 134–144)
TOTAL PROTEIN: 7.2 g/dL (ref 6.0–8.5)

## 2017-09-01 LAB — LIPID PANEL
CHOLESTEROL TOTAL: 132 mg/dL (ref 100–199)
Chol/HDL Ratio: 2.4 ratio (ref 0.0–5.0)
HDL: 54 mg/dL (ref >39)
LDL Calculated: 65 mg/dL (ref 0–99)
TRIGLYCERIDES: 67 mg/dL (ref 0–149)
VLDL CHOLESTEROL CAL: 13 mg/dL (ref 5–40)

## 2017-09-01 NOTE — Assessment & Plan Note (Signed)
Very healthy, no bad habits.

## 2017-09-01 NOTE — Assessment & Plan Note (Signed)
Recheck labs.  LDL much improved since back on simvastatin.

## 2017-09-02 ENCOUNTER — Encounter: Payer: Self-pay | Admitting: Family Medicine

## 2018-02-28 ENCOUNTER — Other Ambulatory Visit: Payer: Self-pay | Admitting: Family Medicine

## 2018-02-28 DIAGNOSIS — E7800 Pure hypercholesterolemia, unspecified: Secondary | ICD-10-CM

## 2018-07-23 ENCOUNTER — Encounter: Payer: Self-pay | Admitting: Family Medicine

## 2018-12-01 IMAGING — CR DG CHEST 2V
2 series · 2 of 2 positions shown · non-contrast
Comparison: January 22, 2005.

CLINICAL DATA: Cough and congestion.  Fever

EXAM:
CHEST  2 VIEW

[w chest pa]
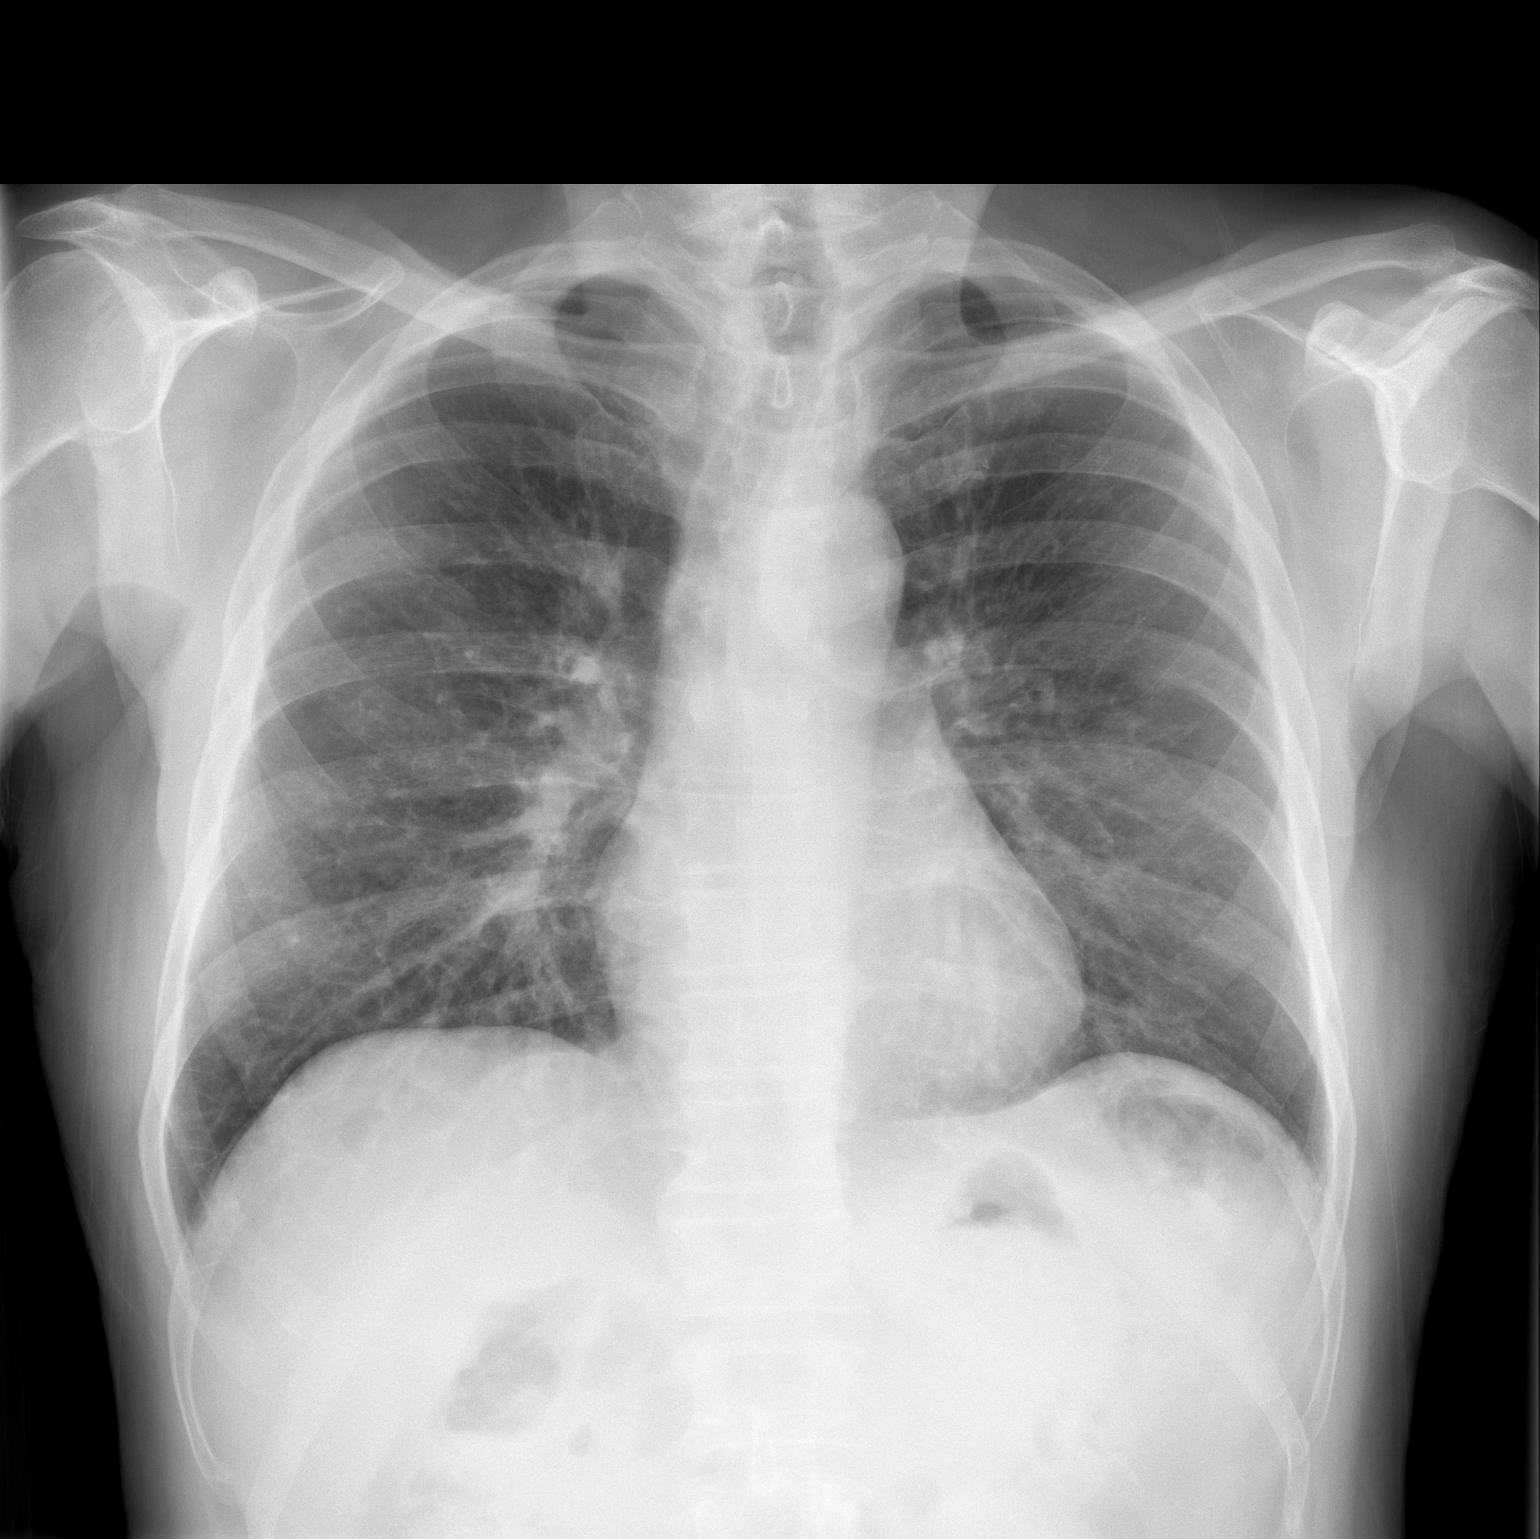

[w chest lat]
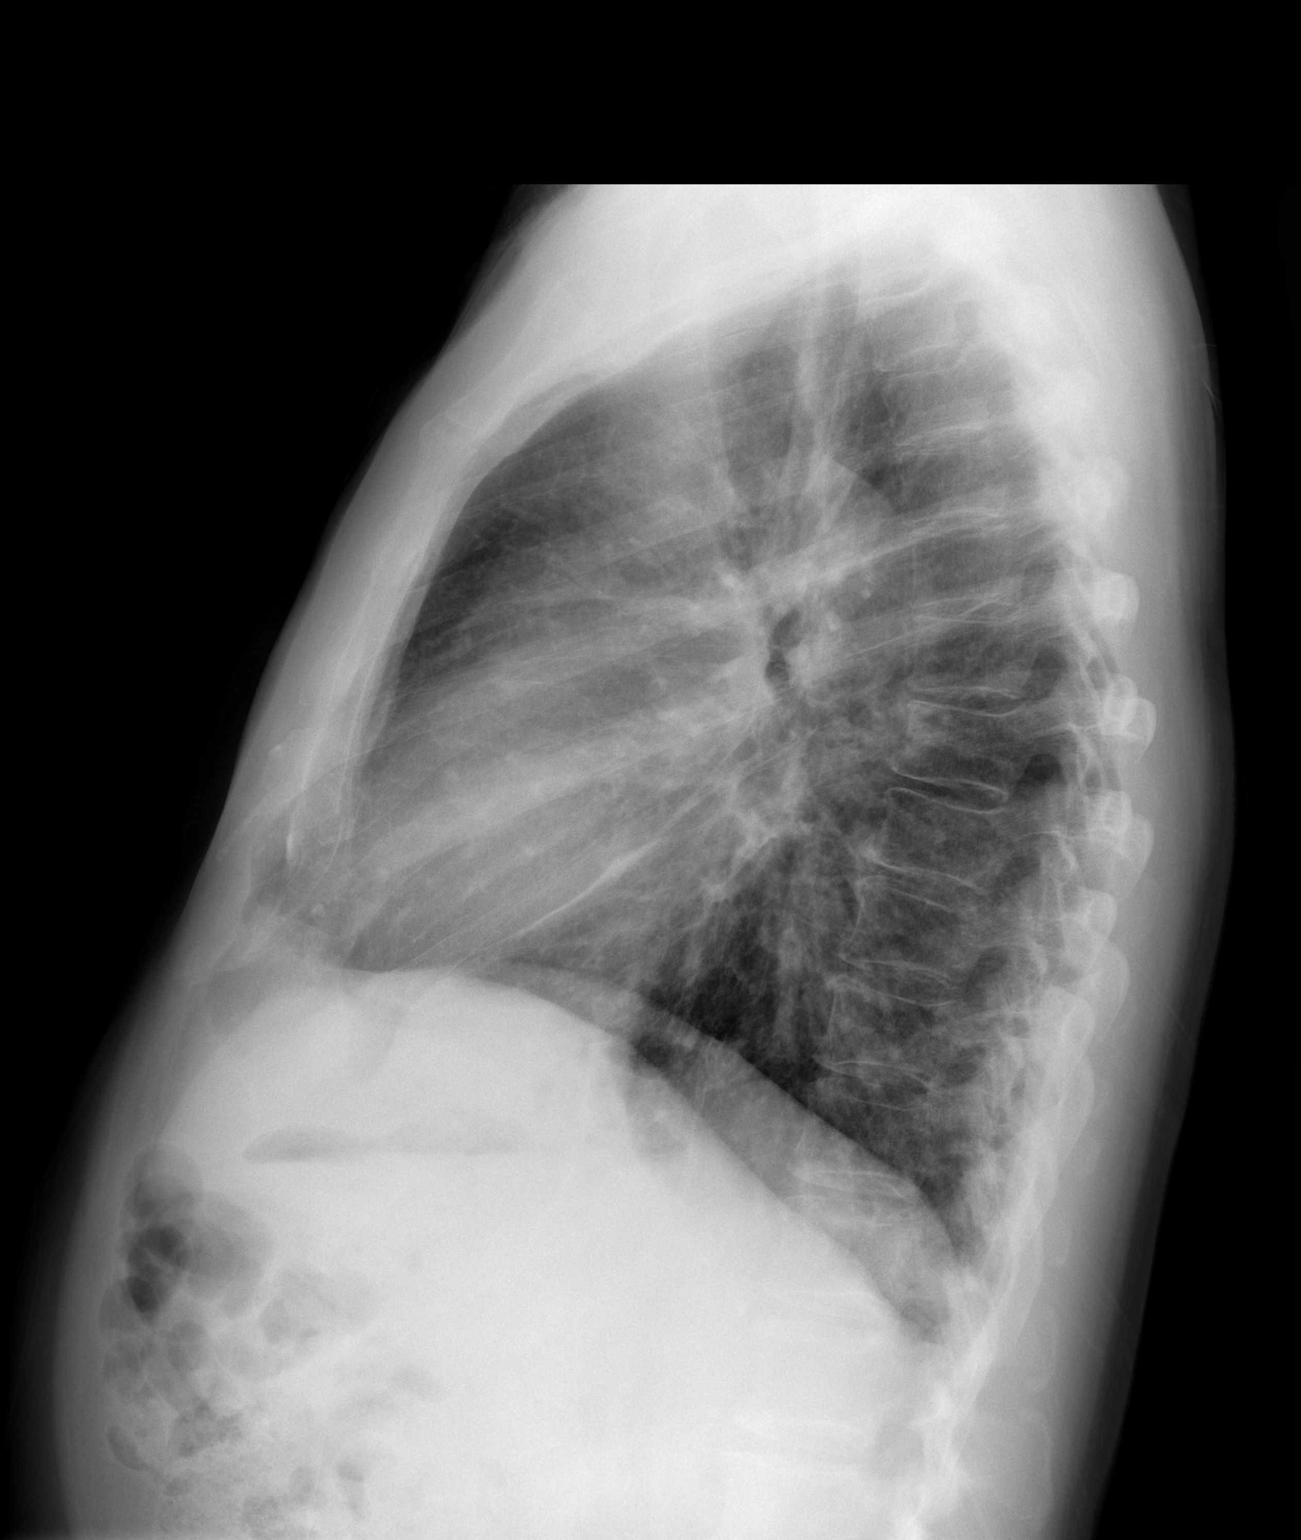

[2 of 2 positions shown; findings below may reference images not displayed]

FINDINGS: There is no edema or consolidation. The heart size and pulmonary
vascularity are normal. No adenopathy. No bone lesions. There is
aortic atherosclerosis.
IMPRESSION: No edema or consolidation.  Aortic atherosclerosis.

## 2019-01-20 ENCOUNTER — Encounter: Payer: Self-pay | Admitting: Family Medicine

## 2019-01-21 ENCOUNTER — Encounter: Payer: Self-pay | Admitting: Family Medicine

## 2019-01-21 DIAGNOSIS — R509 Fever, unspecified: Secondary | ICD-10-CM

## 2019-01-22 ENCOUNTER — Telehealth: Payer: Self-pay

## 2019-01-22 DIAGNOSIS — R509 Fever, unspecified: Secondary | ICD-10-CM | POA: Insufficient documentation

## 2019-01-22 MED ORDER — DOXYCYCLINE HYCLATE 100 MG PO TABS: 100.0000 mg | ORAL_TABLET | Freq: Two times a day (BID) | ORAL | 0 refills | Status: DC

## 2019-01-22 NOTE — Telephone Encounter (Signed)
Noted and agree. 

## 2019-01-22 NOTE — Telephone Encounter (Signed)
Patient calls nurse line stating he received the mychart message from [place]. Patient stated his rapid test was negative this AM, he did get swabbed for the non rapid as well. Patient stated he will go ahead and start the doxy and will await test results for the non rapid swab. Patient stated he does not feel he needs to have his blood drawn for Lyme Disease.

## 2019-01-22 NOTE — Assessment & Plan Note (Signed)
See message.  Given tick exposure, will treat if coronavirus test negative.

## 2019-02-23 ENCOUNTER — Other Ambulatory Visit: Payer: Self-pay | Admitting: Family Medicine

## 2019-02-23 DIAGNOSIS — E7800 Pure hypercholesterolemia, unspecified: Secondary | ICD-10-CM

## 2019-03-27 ENCOUNTER — Encounter: Payer: Self-pay | Admitting: Family Medicine

## 2019-04-15 ENCOUNTER — Ambulatory Visit: Payer: Medicare Other

## 2019-04-22 ENCOUNTER — Ambulatory Visit: Payer: Medicare PPO | Attending: Internal Medicine

## 2019-04-22 DIAGNOSIS — Z23 Encounter for immunization: Secondary | ICD-10-CM | POA: Insufficient documentation

## 2019-04-22 NOTE — Progress Notes (Signed)
   Covid-19 Vaccination Clinic  Name:  John Chung    MRN: [id number] DOB: 1945-08-18  04/22/2019  John Chung was observed post Covid-19 immunization for 15 minutes without incidence. He was provided with Vaccine Information Sheet and instruction to access the V-Safe system.   John Chung was instructed to call 911 with any severe reactions post vaccine: Marland Kitchen Difficulty breathing  . Swelling of your face and throat  . A fast heartbeat  . A bad rash all over your body  . Dizziness and weakness    Immunizations Administered    Name Date Dose VIS Date Route   Pfizer COVID-19 Vaccine 04/22/2019 11:18 AM 0.3 mL 02/23/2019 Intramuscular   Manufacturer: [place]   Lot: [id number]   [place]: [id number]

## 2019-04-26 ENCOUNTER — Ambulatory Visit: Payer: Medicare Other

## 2019-05-16 ENCOUNTER — Ambulatory Visit: Payer: Medicare PPO

## 2019-05-16 ENCOUNTER — Ambulatory Visit: Payer: Medicare PPO | Attending: Internal Medicine

## 2019-05-16 DIAGNOSIS — Z23 Encounter for immunization: Secondary | ICD-10-CM | POA: Insufficient documentation

## 2019-05-16 NOTE — Progress Notes (Signed)
   Covid-19 Vaccination Clinic  Name:  John Chung    MRN: [id number] DOB: Jun 04, 1945  05/16/2019  Mr. John Chung was observed post Covid-19 immunization for 15 minutes without incident. He was provided with Vaccine Information Sheet and instruction to access the V-Safe system.   Mr. John Chung was instructed to call 911 with any severe reactions post vaccine: Marland Kitchen Difficulty breathing  . Swelling of face and throat  . A fast heartbeat  . A bad rash all over body  . Dizziness and weakness   Immunizations Administered    Name Date Dose VIS Date Route   Pfizer COVID-19 Vaccine 05/16/2019  9:33 AM 0.3 mL 02/23/2019 Intramuscular   Manufacturer: [place]   Lot: [id number]   [place]: [id number]

## 2019-06-12 ENCOUNTER — Other Ambulatory Visit: Payer: Self-pay | Admitting: Family Medicine

## 2019-06-12 MED ORDER — KETOCONAZOLE 2 % EX CREA: TOPICAL_CREAM | Freq: Every day | CUTANEOUS | 3 refills | Status: DC

## 2019-06-13 ENCOUNTER — Other Ambulatory Visit: Payer: Self-pay

## 2019-06-13 ENCOUNTER — Ambulatory Visit: Payer: Medicare PPO | Admitting: Family Medicine

## 2019-06-13 ENCOUNTER — Encounter: Payer: Self-pay | Admitting: Family Medicine

## 2019-06-13 VITALS — BP 120/72 | HR 61 | Ht 68.0 in | Wt 179.6 lb

## 2019-06-13 DIAGNOSIS — L989 Disorder of the skin and subcutaneous tissue, unspecified: Secondary | ICD-10-CM

## 2019-06-13 DIAGNOSIS — C4442 Squamous cell carcinoma of skin of scalp and neck: Secondary | ICD-10-CM | POA: Diagnosis not present

## 2019-06-13 NOTE — Patient Instructions (Signed)
Great meeting you today!  I am unsure what that growth in your neck is but I performed a punch biopsy so we can look at under microscope.  This should come back in a few days.  I will call you at your [facility] phone to tell you the results.  Once I know what it is I can better tell you we can do about long-term.  For the next 24 to 48 hours keep it covered.  He can put a Band-Aid on there.  He gave you some supplies just in case it starts bleeding on you later.

## 2019-06-15 DIAGNOSIS — L989 Disorder of the skin and subcutaneous tissue, unspecified: Secondary | ICD-10-CM | POA: Insufficient documentation

## 2019-06-15 NOTE — Assessment & Plan Note (Signed)
Highly suspicious for carcinoma of skin either squamous cell or basal cell. Performed punch biopsy in usual fashion. Will await pathology and update when becomes available.

## 2019-06-15 NOTE — Progress Notes (Signed)
Punch biopsy procedure note  After informed written consent was obtained, using Betadine for cleansing and 1% Lidocaine with epinephrine for anesthetic, with sterile technique a 4 mm punch biopsy was used to obtain a biopsy specimen of the lesion. Hemostasis was obtained by pressure dressing was applied, and wound care instructions provided. Be alert for any signs of cutaneous infection. The specimen is labeled and sent to pathology for evaluation. The procedure was well tolerated without complications.  Guadalupe Dawn MD PGY-3 Family Medicine Resident

## 2019-06-15 NOTE — Progress Notes (Signed)
   CHIEF COMPLAINT / HPI: 75 year old male who presents for a right neck skin lesion. He states that it has been there for about 4 weeks. It has slowly been growing in size. He states that he tried to lance it around one week ago, and a strange black substance came out of it. He felt that it would fall off eventually but it did not. It is not painful, he feels it get caught on clothing eventually.  PERTINENT  PMH / PSH:    OBJECTIVE: BP 120/72   Pulse 61   Ht 5\' 8"  (1.727 m)   Wt 179 lb 9.6 oz (81.5 kg)   SpO2 98%   BMI 27.31 kg/m   Gen: 74 year old male, no acute distress, very pleasant CV: skin warm and dry Resp: no respiratory distress, able to speak in clear coherent sentences Neuro: Alert and oriented, Speech   clear, No gross deficits  Derm: well circumscribed raised lesion, no painful to touch. Central area of ulceration on right neck   ASSESSMENT / PLAN:  Skin lesion Highly suspicious for carcinoma of skin either squamous cell or basal cell. Performed punch biopsy in usual fashion. Will await pathology and update when becomes available.     Guadalupe Dawn, MD [place]

## 2019-06-18 ENCOUNTER — Telehealth: Payer: Self-pay | Admitting: Family Medicine

## 2019-06-18 DIAGNOSIS — C4442 Squamous cell carcinoma of skin of scalp and neck: Secondary | ICD-10-CM

## 2019-06-18 NOTE — Telephone Encounter (Signed)
Family medicine telephone note  [place] able [place] [phone].  Discussed results of well differentiated squamous cell carcinoma from the biopsy.  Explained to patient that this is a relatively slowly progressing skin cancer and that the best treatment is likely surgical excision.  Explained that this is very often curative given the size and pathology.  Given the location on his neck we discussed the possibilities and ultimately decided that a referral to general surgery for removal is the best option for his safety and for curative excision.  Explained that the most common risk factor for this is sun exposure and that this may have arisen from an existing actinic keratosis.  Place patient's [facility] number in the chart.  Place general surgery referral for excision of squamous cell carcinoma.  Patient appreciative of call and explanation.  Guadalupe Dawn MD PGY-3 Family Medicine Resident

## 2019-06-26 ENCOUNTER — Encounter: Payer: Self-pay | Admitting: Family Medicine

## 2019-06-27 ENCOUNTER — Encounter: Payer: Self-pay | Admitting: Family Medicine

## 2019-06-28 ENCOUNTER — Other Ambulatory Visit: Payer: Self-pay | Admitting: Family Medicine

## 2019-06-28 DIAGNOSIS — C4442 Squamous cell carcinoma of skin of scalp and neck: Secondary | ICD-10-CM | POA: Insufficient documentation

## 2019-06-28 NOTE — Progress Notes (Signed)
Discussed over mychart. Patient was told by general surgery that the referral would need to go to ENT due to the location of the lesion. Will place this consult.  Guadalupe Dawn MD PGY-3 Family Medicine Resident

## 2019-07-05 DIAGNOSIS — Z823 Family history of stroke: Secondary | ICD-10-CM | POA: Diagnosis not present

## 2019-07-05 DIAGNOSIS — R030 Elevated blood-pressure reading, without diagnosis of hypertension: Secondary | ICD-10-CM | POA: Diagnosis not present

## 2019-07-05 DIAGNOSIS — Z85828 Personal history of other malignant neoplasm of skin: Secondary | ICD-10-CM | POA: Diagnosis not present

## 2019-07-05 DIAGNOSIS — Z833 Family history of diabetes mellitus: Secondary | ICD-10-CM | POA: Diagnosis not present

## 2019-07-05 DIAGNOSIS — Z8249 Family history of ischemic heart disease and other diseases of the circulatory system: Secondary | ICD-10-CM | POA: Diagnosis not present

## 2019-07-05 DIAGNOSIS — H3530 Unspecified macular degeneration: Secondary | ICD-10-CM | POA: Diagnosis not present

## 2019-07-05 DIAGNOSIS — I739 Peripheral vascular disease, unspecified: Secondary | ICD-10-CM | POA: Diagnosis not present

## 2019-07-05 DIAGNOSIS — Z87891 Personal history of nicotine dependence: Secondary | ICD-10-CM | POA: Diagnosis not present

## 2019-07-10 ENCOUNTER — Ambulatory Visit (INDEPENDENT_AMBULATORY_CARE_PROVIDER_SITE_OTHER): Payer: Medicare PPO | Admitting: Otolaryngology

## 2019-07-10 ENCOUNTER — Other Ambulatory Visit: Payer: Self-pay

## 2019-07-10 VITALS — Temp 97.9°F

## 2019-07-10 DIAGNOSIS — C4442 Squamous cell carcinoma of skin of scalp and neck: Secondary | ICD-10-CM

## 2019-07-10 DIAGNOSIS — C4440 Unspecified malignant neoplasm of skin of scalp and neck: Secondary | ICD-10-CM

## 2019-07-10 NOTE — Progress Notes (Signed)
HPI: John Chung is a 74 y.o. male who presents is referred by Dr. Andria Frames for evaluation of well-differentiated squamous cell carcinoma of the right neck skin.  Patient apparently had a punch biopsy performed 3-1/2 weeks ago that demonstrated well-differentiated squamous cell carcinoma.  He is referred here to have this removed. He takes 1 baby aspirin a day.  Past Medical History:  Diagnosis Date  . Elevated cholesterol   . Low back pain    Past Surgical History:  Procedure Laterality Date  . ESOPHAGOGASTRODUODENOSCOPY     bleeding ulcer   Social History   Socioeconomic History  . Marital status: Married    Spouse name: Not on file  . Number of children: Not on file  . Years of education: Not on file  . Highest education level: Not on file  Occupational History  . Not on file  Tobacco Use  . Smoking status: Former Research scientist (life sciences)  . Smokeless tobacco: Former Systems developer    Quit date: 03/30/1981  Substance and Sexual Activity  . Alcohol use: Yes    Comment: beer occas.  . Drug use: No  . Sexual activity: Yes    Birth control/protection: Other-see comments    Comment: partner post menopausal  Other Topics Concern  . Not on file  Social History Narrative  . Not on file   Social Determinants of Health   Financial Resource Strain:   . Difficulty of Paying Living Expenses:   Food Insecurity:   . Worried About Charity fundraiser in the Last Year:   . Arboriculturist in the Last Year:   Transportation Needs:   . Film/video editor (Medical):   Marland Kitchen Lack of Transportation (Non-Medical):   Physical Activity:   . Days of Exercise per Week:   . Minutes of Exercise per Session:   Stress:   . Feeling of Stress :   Social Connections:   . Frequency of Communication with Friends and Family:   . Frequency of Social Gatherings with Friends and Family:   . Attends Religious Services:   . Active Member of Clubs or Organizations:   . Attends Archivist Meetings:   Marland Kitchen Marital  Status:    Family History  Problem Relation Age of Onset  . Colon cancer Neg Hx   . Pancreatic cancer Neg Hx   . Rectal cancer Neg Hx   . Stomach cancer Neg Hx    No Known Allergies Prior to Admission medications   Medication Sig Start Date End Date Taking? Authorizing Provider  aspirin 81 MG tablet Take 81 mg by mouth daily.     Yes [provider]  ketoconazole (NIZORAL) 2 % cream Apply topically daily. 06/12/19  Yes Hensel, Jamal Collin, MD  Multiple Vitamins-Minerals (PRESERVISION AREDS 2) CAPS Take 1 capsule by mouth daily. 12/14/11  Yes Hensel, Jamal Collin, MD  simvastatin (ZOCOR) 40 MG tablet TAKE ONE TABLET BY MOUTH AT BEDTIME  02/23/19  Yes Hensel, Jamal Collin, MD  doxycycline (VIBRA-TABS) 100 MG tablet Take 1 tablet (100 mg total) by mouth 2 (two) times daily. Patient not taking: Reported on 07/10/2019 01/22/19   Zenia Resides, MD     Positive ROS: Otherwise negative  All other systems have been reviewed and were otherwise negative with the exception of those mentioned in the HPI and as above.  Physical Exam: Constitutional: Alert, well-appearing, no acute distress Ears: External ears without lesions or tenderness. Ear canals are clear bilaterally with intact, clear TMs.  Nasal: External nose without lesions.. Clear nasal passages Oral: Lips and gums without lesions. Tongue and palate mucosa without lesions. Posterior oropharynx clear. Neck: No palpable adenopathy or masses.  Patient has a small healing lesion on the right neck measuring approximately 1 to 1 1/2 cm in size.  Patient points this out where the biopsy was performed.  Minimal induration.  No palpable adenopathy. Respiratory: Breathing comfortably  Skin: No facial/neck lesions or rash noted.  Procedures  Assessment: Right neck skin squamous cell carcinoma well differentiated.  Plan: Discussed with patient concerning excision of this under local anesthesia and we will schedule this in the office for this  Thursday.   Radene Journey, MD   CC:

## 2019-07-12 ENCOUNTER — Other Ambulatory Visit (INDEPENDENT_AMBULATORY_CARE_PROVIDER_SITE_OTHER): Payer: Self-pay

## 2019-07-12 ENCOUNTER — Other Ambulatory Visit (HOSPITAL_COMMUNITY)
Admission: RE | Admit: 2019-07-12 | Discharge: 2019-07-12 | Disposition: A | Discharge status: Home / Self Care | Payer: Medicare PPO | Source: Ambulatory Visit | Attending: Otolaryngology | Admitting: Otolaryngology

## 2019-07-12 ENCOUNTER — Ambulatory Visit (INDEPENDENT_AMBULATORY_CARE_PROVIDER_SITE_OTHER): Payer: Medicare PPO | Admitting: Otolaryngology

## 2019-07-12 ENCOUNTER — Other Ambulatory Visit: Payer: Self-pay

## 2019-07-12 VITALS — Temp 97.9°F

## 2019-07-12 DIAGNOSIS — C4442 Squamous cell carcinoma of skin of scalp and neck: Secondary | ICD-10-CM

## 2019-07-12 DIAGNOSIS — L089 Local infection of the skin and subcutaneous tissue, unspecified: Secondary | ICD-10-CM | POA: Diagnosis not present

## 2019-07-12 NOTE — Progress Notes (Signed)
HPI: John Chung is a 74 y.o. male who returns today for evaluation of right neck squamous cell carcinoma.  Patient had a previous punch biopsy with his medical doctor which demonstrated squamous cell carcinoma of a 1 cm right lower neck skin lesion.  This was pointed out by the patient.  He presents today to have this excised using local anesthetic in the office..  Past Medical History:  Diagnosis Date  . Elevated cholesterol   . Low back pain    Past Surgical History:  Procedure Laterality Date  . ESOPHAGOGASTRODUODENOSCOPY     bleeding ulcer   Social History   Socioeconomic History  . Marital status: Married    Spouse name: Not on file  . Number of children: Not on file  . Years of education: Not on file  . Highest education level: Not on file  Occupational History  . Not on file  Tobacco Use  . Smoking status: Former Research scientist (life sciences)  . Smokeless tobacco: Former Systems developer    Quit date: 03/30/1981  Substance and Sexual Activity  . Alcohol use: Yes    Comment: beer occas.  . Drug use: No  . Sexual activity: Yes    Birth control/protection: Other-see comments    Comment: partner post menopausal  Other Topics Concern  . Not on file  Social History Narrative  . Not on file   Social Determinants of Health   Financial Resource Strain:   . Difficulty of Paying Living Expenses:   Food Insecurity:   . Worried About Charity fundraiser in the Last Year:   . Arboriculturist in the Last Year:   Transportation Needs:   . Film/video editor (Medical):   Marland Kitchen Lack of Transportation (Non-Medical):   Physical Activity:   . Days of Exercise per Week:   . Minutes of Exercise per Session:   Stress:   . Feeling of Stress :   Social Connections:   . Frequency of Communication with Friends and Family:   . Frequency of Social Gatherings with Friends and Family:   . Attends Religious Services:   . Active Member of Clubs or Organizations:   . Attends Archivist Meetings:   Marland Kitchen  Marital Status:    Family History  Problem Relation Age of Onset  . Colon cancer Neg Hx   . Pancreatic cancer Neg Hx   . Rectal cancer Neg Hx   . Stomach cancer Neg Hx    No Known Allergies Prior to Admission medications   Medication Sig Start Date End Date Taking? Authorizing Provider  aspirin 81 MG tablet Take 81 mg by mouth daily.     Yes [provider]  ketoconazole (NIZORAL) 2 % cream Apply topically daily. 06/12/19  Yes Hensel, Jamal Collin, MD  Multiple Vitamins-Minerals (PRESERVISION AREDS 2) CAPS Take 1 capsule by mouth daily. 12/14/11  Yes Hensel, Jamal Collin, MD  simvastatin (ZOCOR) 40 MG tablet TAKE ONE TABLET BY MOUTH AT BEDTIME  02/23/19  Yes Hensel, Jamal Collin, MD  doxycycline (VIBRA-TABS) 100 MG tablet Take 1 tablet (100 mg total) by mouth 2 (two) times daily. Patient not taking: Reported on 07/12/2019 01/22/19   Zenia Resides, MD     Positive ROS: Otherwise negative  All other systems have been reviewed and were otherwise negative with the exception of those mentioned in the HPI and as above.  Physical Exam: Constitutional: Alert, well-appearing, no acute distress Ears: External ears without lesions or tenderness. Ear canals are  clear bilaterally with intact, clear TMs.  Nasal: External nose without lesion. Clear nasal passages Oral: Lips and gums without lesions. Tongue and palate mucosa without lesions. Posterior oropharynx clear. Neck: No palpable adenopathy or masses Respiratory: Breathing comfortably  Skin: Patient has an approximate 1 cm superficial right lower neck skin lesion.  On palpation this is superficial and is not adherent to subcutaneous fat.  Procedure: The skin lesion was marked out with an elliptical excision with approximate 3 mm margins.  Area was then prepped with alcohol and injected with 3 cc of 1% Xylocaine with epinephrine.  The area was then prepped with Betadine swab and draped out with sterile towels.  The skin lesion was  elliptically excised with a scalpel and scissors and sent to pathology.  He had minimal bleeding and the small defect was closed with 4-0 chromic sutures to reapproximate skin edges and 5-0 nylon sutures were on the skin.  Mupirocin 2% ointment was applied followed by large Band-Aid.  Patient tolerated this well.  Procedures  Assessment: Right neck squamous cell carcinoma of the skin  Plan: This was elliptically excised in the office and closed with intermediate closure. Patient will follow up in 10 days to have the sutures removed and review final pathology.  As he will be out of town in 1 week. He was given a prescription for Keflex 500 mg 3 times daily x5 days if he develops any erythema or swelling of the neck incision site as he will be out of town next week.   Radene Journey, MD

## 2019-07-16 LAB — SURGICAL PATHOLOGY

## 2019-07-23 ENCOUNTER — Other Ambulatory Visit: Payer: Self-pay

## 2019-07-23 ENCOUNTER — Ambulatory Visit (INDEPENDENT_AMBULATORY_CARE_PROVIDER_SITE_OTHER): Payer: Medicare PPO | Admitting: Otolaryngology

## 2019-07-23 VITALS — Temp 98.1°F

## 2019-07-23 DIAGNOSIS — Z4889 Encounter for other specified surgical aftercare: Secondary | ICD-10-CM

## 2019-07-23 NOTE — Progress Notes (Signed)
HPI: John Chung is a 74 y.o. male who presents 10 days s/p excision of right neck squamous cell carcinoma.  He is doing well with no specific complaints.  Presents today to have the sutures removed.  Final pathology report revealed no residual carcinoma within the excision specimen with negative margins..   Past Medical History:  Diagnosis Date  . Elevated cholesterol   . Low back pain    Past Surgical History:  Procedure Laterality Date  . ESOPHAGOGASTRODUODENOSCOPY     bleeding ulcer   Social History   Socioeconomic History  . Marital status: Married    Spouse name: Not on file  . Number of children: Not on file  . Years of education: Not on file  . Highest education level: Not on file  Occupational History  . Not on file  Tobacco Use  . Smoking status: Former Research scientist (life sciences)  . Smokeless tobacco: Former Systems developer    Quit date: 03/30/1981  Substance and Sexual Activity  . Alcohol use: Yes    Comment: beer occas.  . Drug use: No  . Sexual activity: Yes    Birth control/protection: Other-see comments    Comment: partner post menopausal  Other Topics Concern  . Not on file  Social History Narrative  . Not on file   Social Determinants of Health   Financial Resource Strain:   . Difficulty of Paying Living Expenses:   Food Insecurity:   . Worried About Charity fundraiser in the Last Year:   . Arboriculturist in the Last Year:   Transportation Needs:   . Film/video editor (Medical):   Marland Kitchen Lack of Transportation (Non-Medical):   Physical Activity:   . Days of Exercise per Week:   . Minutes of Exercise per Session:   Stress:   . Feeling of Stress :   Social Connections:   . Frequency of Communication with Friends and Family:   . Frequency of Social Gatherings with Friends and Family:   . Attends Religious Services:   . Active Member of Clubs or Organizations:   . Attends Archivist Meetings:   Marland Kitchen Marital Status:    Family History  Problem Relation Age of  Onset  . Colon cancer Neg Hx   . Pancreatic cancer Neg Hx   . Rectal cancer Neg Hx   . Stomach cancer Neg Hx    No Known Allergies Prior to Admission medications   Medication Sig Start Date End Date Taking? Authorizing Provider  aspirin 81 MG tablet Take 81 mg by mouth daily.     Yes [provider]  doxycycline (VIBRA-TABS) 100 MG tablet Take 1 tablet (100 mg total) by mouth 2 (two) times daily. 01/22/19  Yes Hensel, Jamal Collin, MD  ketoconazole (NIZORAL) 2 % cream Apply topically daily. 06/12/19  Yes Hensel, Jamal Collin, MD  Multiple Vitamins-Minerals (PRESERVISION AREDS 2) CAPS Take 1 capsule by mouth daily. 12/14/11  Yes Hensel, Jamal Collin, MD  simvastatin (ZOCOR) 40 MG tablet TAKE ONE TABLET BY MOUTH AT BEDTIME  02/23/19  Yes Hensel, Jamal Collin, MD     Physical Exam: Excision site is healing nicely and sutures were removed.  Antibiotic ointment was applied.  No signs of infection.   Assessment: S/p excision of skin squamous cell carcinoma from the right posterior neck  Plan: He will follow-up as needed   Radene Journey, MD

## 2019-08-24 DIAGNOSIS — L570 Actinic keratosis: Secondary | ICD-10-CM | POA: Diagnosis not present

## 2019-08-24 DIAGNOSIS — X32XXXA Exposure to sunlight, initial encounter: Secondary | ICD-10-CM | POA: Diagnosis not present

## 2019-08-24 DIAGNOSIS — L820 Inflamed seborrheic keratosis: Secondary | ICD-10-CM | POA: Diagnosis not present

## 2020-01-05 DIAGNOSIS — Z20822 Contact with and (suspected) exposure to covid-19: Secondary | ICD-10-CM | POA: Diagnosis not present

## 2020-01-05 DIAGNOSIS — Z03818 Encounter for observation for suspected exposure to other biological agents ruled out: Secondary | ICD-10-CM | POA: Diagnosis not present

## 2020-02-25 ENCOUNTER — Other Ambulatory Visit: Payer: Self-pay | Admitting: Family Medicine

## 2020-02-25 DIAGNOSIS — E7800 Pure hypercholesterolemia, unspecified: Secondary | ICD-10-CM

## 2020-02-27 ENCOUNTER — Encounter: Payer: Self-pay | Admitting: Family Medicine

## 2020-06-16 ENCOUNTER — Ambulatory Visit (INDEPENDENT_AMBULATORY_CARE_PROVIDER_SITE_OTHER): Payer: Medicare PPO

## 2020-06-16 ENCOUNTER — Other Ambulatory Visit: Payer: Self-pay

## 2020-06-16 DIAGNOSIS — Z23 Encounter for immunization: Secondary | ICD-10-CM | POA: Diagnosis not present

## 2020-10-24 ENCOUNTER — Telehealth: Payer: Self-pay

## 2020-10-24 NOTE — Telephone Encounter (Signed)
Pt is on the [place] List for AWV. LOV with PCP was 06/13/2019.   Pt may benefit from an in office visit and schedule AWV if pt agrees.

## 2020-12-26 DIAGNOSIS — D225 Melanocytic nevi of trunk: Secondary | ICD-10-CM | POA: Diagnosis not present

## 2020-12-26 DIAGNOSIS — L821 Other seborrheic keratosis: Secondary | ICD-10-CM | POA: Diagnosis not present

## 2021-02-19 ENCOUNTER — Ambulatory Visit: Payer: Medicare PPO | Admitting: Family Medicine

## 2021-02-19 ENCOUNTER — Encounter: Payer: Self-pay | Admitting: Family Medicine

## 2021-02-19 ENCOUNTER — Other Ambulatory Visit: Payer: Self-pay

## 2021-02-19 VITALS — BP 130/60 | HR 82 | Wt 178.0 lb

## 2021-02-19 DIAGNOSIS — Z833 Family history of diabetes mellitus: Secondary | ICD-10-CM | POA: Diagnosis not present

## 2021-02-19 DIAGNOSIS — Z0000 Encounter for general adult medical examination without abnormal findings: Secondary | ICD-10-CM

## 2021-02-19 DIAGNOSIS — E7800 Pure hypercholesterolemia, unspecified: Secondary | ICD-10-CM | POA: Diagnosis not present

## 2021-02-19 DIAGNOSIS — S40811A Abrasion of right upper arm, initial encounter: Secondary | ICD-10-CM | POA: Insufficient documentation

## 2021-02-19 DIAGNOSIS — Z23 Encounter for immunization: Secondary | ICD-10-CM

## 2021-02-19 LAB — POCT GLYCOSYLATED HEMOGLOBIN (HGB A1C): Hemoglobin A1C: 5.5 % (ref 4.0–5.6)

## 2021-02-19 NOTE — Patient Instructions (Signed)
You look great I will call with lab results.

## 2021-02-20 ENCOUNTER — Encounter: Payer: Self-pay | Admitting: Family Medicine

## 2021-02-20 LAB — LIPID PANEL
Chol/HDL Ratio: 3.2 ratio (ref 0.0–5.0)
Cholesterol, Total: 144 mg/dL (ref 100–199)
HDL: 45 mg/dL (ref >39)
LDL Chol Calc (NIH): 82 mg/dL (ref 0–99)
Triglycerides: 88 mg/dL (ref 0–149)
VLDL Cholesterol Cal: 17 mg/dL (ref 5–40)

## 2021-02-20 LAB — TSH: TSH: 1.73 u[IU]/mL (ref 0.450–4.500)

## 2021-02-20 LAB — CMP14+EGFR
ALT: 29 IU/L (ref 0–44)
AST: 25 IU/L (ref 0–40)
Albumin/Globulin Ratio: 1.5 (ref 1.2–2.2)
Albumin: 4.4 g/dL (ref 3.7–4.7)
Alkaline Phosphatase: 94 IU/L (ref 44–121)
BUN/Creatinine Ratio: 26 — ABNORMAL HIGH (ref 10–24)
BUN: 22 mg/dL (ref 8–27)
Bilirubin Total: 0.2 mg/dL (ref 0.0–1.2)
CO2: 20 mmol/L (ref 20–29)
Calcium: 9.3 mg/dL (ref 8.6–10.2)
Chloride: 101 mmol/L (ref 96–106)
Creatinine, Ser: 0.84 mg/dL (ref 0.76–1.27)
Globulin, Total: 2.9 g/dL (ref 1.5–4.5)
Glucose: 83 mg/dL (ref 70–99)
Potassium: 4.5 mmol/L (ref 3.5–5.2)
Sodium: 140 mmol/L (ref 134–144)
Total Protein: 7.3 g/dL (ref 6.0–8.5)
eGFR: 90 mL/min/{1.73_m2} (ref >59)

## 2021-02-20 NOTE — Assessment & Plan Note (Signed)
Check A1C - still in non diabetic range.

## 2021-02-20 NOTE — Assessment & Plan Note (Signed)
Tdap given.  

## 2021-02-20 NOTE — Assessment & Plan Note (Signed)
Healthy male with no at risk behaviors.  Watch weight gain.

## 2021-02-20 NOTE — Assessment & Plan Note (Signed)
Lipid panel reassuring   No change

## 2021-02-20 NOTE — Progress Notes (Signed)
    SUBJECTIVE:   CHIEF COMPLAINT / HPI:   75 yo male for annual physical.  He missed a couple of years due to covid Acute issues: none Chronic issues: Hypercholesterolemia.  Primary prevention.  On simvastatin daily without muscle aches or other side effects.  Due for recheck lipid Multiple skin lesions:  Previous hx of skin cancer.  Followed by derm. Lifestyle: Very healthy.  No at risk behaviors.  He has gained 15 lbs.  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  Still very active. Screening issues: Aged out of colon cancer screen.   Immunizations: states he has gotten shingrix, flu and bivalent covid booster.  Needs tetanus.  He states he has an abrasion on rit forearm today.  PERTINENT  PMH / PSH: Denies CP, DOE, abd pain, focal weakness or numbness, worrisome skin changes, and bleeding.  Denies change in bowel, bladder, or appetite.  OBJECTIVE:   BP 130/60   Pulse 82   Wt 178 lb (80.7 kg)   SpO2 98%   BMI 27.06 kg/m   VS including healthy BMI noted. HEENT WNL Neck supple Lungs clear C[place]diac RRR without m or g Abd benign Ext no edema. Non infect abrasion of right arm at elbow. Neuro, motor sensory, gait, cognition and affect all grossly normal.  ASSESSMENT/PLAN:   Preventative health care Healthy male with no at risk behaviors.  Watch weight gain.  Family history of diabetes mellitus Check A1C - still in non diabetic range.  Abrasion of right arm Tdap given  HYPERCHOLESTEROLEMIA Lipid panel reassuring   No change     Zenia Resides, [place]

## 2021-03-13 DIAGNOSIS — E785 Hyperlipidemia, unspecified: Secondary | ICD-10-CM | POA: Diagnosis not present

## 2021-03-13 DIAGNOSIS — M1990 Unspecified osteoarthritis, unspecified site: Secondary | ICD-10-CM | POA: Diagnosis not present

## 2021-03-13 DIAGNOSIS — Z833 Family history of diabetes mellitus: Secondary | ICD-10-CM | POA: Diagnosis not present

## 2021-03-13 DIAGNOSIS — Z87891 Personal history of nicotine dependence: Secondary | ICD-10-CM | POA: Diagnosis not present

## 2021-03-13 DIAGNOSIS — Z8249 Family history of ischemic heart disease and other diseases of the circulatory system: Secondary | ICD-10-CM | POA: Diagnosis not present

## 2021-03-13 DIAGNOSIS — R030 Elevated blood-pressure reading, without diagnosis of hypertension: Secondary | ICD-10-CM | POA: Diagnosis not present

## 2021-03-18 ENCOUNTER — Encounter: Payer: Self-pay | Admitting: Family Medicine

## 2021-03-18 ENCOUNTER — Other Ambulatory Visit: Payer: Self-pay

## 2021-03-18 ENCOUNTER — Ambulatory Visit: Payer: Medicare PPO | Admitting: Family Medicine

## 2021-03-18 DIAGNOSIS — H6123 Impacted cerumen, bilateral: Secondary | ICD-10-CM

## 2021-03-18 NOTE — Patient Instructions (Signed)
Your ears are now clean.   Never use q tips. Good luck with the hearing aids.

## 2021-03-19 ENCOUNTER — Encounter: Payer: Self-pay | Admitting: Family Medicine

## 2021-03-19 DIAGNOSIS — H6123 Impacted cerumen, bilateral: Secondary | ICD-10-CM | POA: Insufficient documentation

## 2021-03-19 NOTE — Assessment & Plan Note (Signed)
Irrigated clear without difficulty.

## 2021-03-19 NOTE — Progress Notes (Signed)
° ° °  SUBJECTIVE:   CHIEF COMPLAINT / HPI:   Wants ears cleaned. Patient went to [facility] for hearing aid and was told needed cerumen cleared before he could be fitted.  Has a hx of recurrent cerumen impaction.    OBJECTIVE:   BP 122/60    Pulse 74    Wt 180 lb 3.2 oz (81.7 kg)    SpO2 96%    BMI 27.40 kg/m   Ears, Cerumen impaction bilaterally.  Irrigated clear without difficulty.  TMs and canals normal post irrigation.  Sadly, his hearing did not seem to improve with the cerumen removal.  He will proceed with hearing aids.    ASSESSMENT/PLAN:   Hearing loss due to cerumen impaction, bilateral Irrigated clear without difficulty.     Zenia Resides, MD [place]

## 2021-04-05 ENCOUNTER — Other Ambulatory Visit: Payer: Self-pay | Admitting: Family Medicine

## 2021-04-05 DIAGNOSIS — E7800 Pure hypercholesterolemia, unspecified: Secondary | ICD-10-CM

## 2021-08-18 ENCOUNTER — Encounter: Payer: Self-pay | Admitting: *Deleted

## 2021-09-25 DIAGNOSIS — C44722 Squamous cell carcinoma of skin of right lower limb, including hip: Secondary | ICD-10-CM | POA: Diagnosis not present

## 2021-11-24 DIAGNOSIS — X32XXXD Exposure to sunlight, subsequent encounter: Secondary | ICD-10-CM | POA: Diagnosis not present

## 2021-11-24 DIAGNOSIS — Z08 Encounter for follow-up examination after completed treatment for malignant neoplasm: Secondary | ICD-10-CM | POA: Diagnosis not present

## 2021-11-24 DIAGNOSIS — Z85828 Personal history of other malignant neoplasm of skin: Secondary | ICD-10-CM | POA: Diagnosis not present

## 2021-11-24 DIAGNOSIS — L570 Actinic keratosis: Secondary | ICD-10-CM | POA: Diagnosis not present

## 2022-02-26 ENCOUNTER — Other Ambulatory Visit: Payer: Self-pay | Admitting: Family Medicine

## 2022-02-26 DIAGNOSIS — E7800 Pure hypercholesterolemia, unspecified: Secondary | ICD-10-CM

## 2022-04-06 DIAGNOSIS — B078 Other viral warts: Secondary | ICD-10-CM | POA: Diagnosis not present

## 2022-04-06 DIAGNOSIS — L820 Inflamed seborrheic keratosis: Secondary | ICD-10-CM | POA: Diagnosis not present

## 2022-04-20 ENCOUNTER — Encounter: Payer: Self-pay | Admitting: Family Medicine

## 2022-04-22 NOTE — Patient Instructions (Signed)
Health Maintenance, Male Adopting a healthy lifestyle and getting preventive care are important in promoting health and wellness. Ask your health care provider about: The right schedule for you to have regular tests and exams. Things you can do on your own to prevent diseases and keep yourself healthy. What should I know about diet, weight, and exercise? Eat a healthy diet  Eat a diet that includes plenty of vegetables, fruits, low-fat dairy products, and lean protein. Do not eat a lot of foods that are high in solid fats, added sugars, or sodium. Maintain a healthy weight Body mass index (BMI) is a measurement that can be used to identify possible weight problems. It estimates body fat based on height and weight. Your health care provider can help determine your BMI and help you achieve or maintain a healthy weight. Get regular exercise Get regular exercise. This is one of the most important things you can do for your health. Most adults should: Exercise for at least 150 minutes each week. The exercise should increase your heart rate and make you sweat (moderate-intensity exercise). Do strengthening exercises at least twice a week. This is in addition to the moderate-intensity exercise. Spend less time sitting. Even light physical activity can be beneficial. Watch cholesterol and blood lipids Have your blood tested for lipids and cholesterol at 78 years of age, then have this test every 5 years. You may need to have your cholesterol levels checked more often if: Your lipid or cholesterol levels are high. You are older than 78 years of age. You are at high risk for heart disease. What should I know about cancer screening? Many types of cancers can be detected early and may often be prevented. Depending on your health history and family history, you may need to have cancer screening at various ages. This may include screening for: Colorectal cancer. Prostate cancer. Skin cancer. Lung  cancer. What should I know about heart disease, diabetes, and high blood pressure? Blood pressure and heart disease High blood pressure causes heart disease and increases the risk of stroke. This is more likely to develop in people who have high blood pressure readings or are overweight. Talk with your health care provider about your target blood pressure readings. Have your blood pressure checked: Every 3-5 years if you are 18-39 years of age. Every year if you are 40 years old or older. If you are between the ages of 65 and 75 and are a current or former smoker, ask your health care provider if you should have a one-time screening for abdominal aortic aneurysm (AAA). Diabetes Have regular diabetes screenings. This checks your fasting blood sugar level. Have the screening done: Once every three years after age 45 if you are at a normal weight and have a low risk for diabetes. More often and at a younger age if you are overweight or have a high risk for diabetes. What should I know about preventing infection? Hepatitis B If you have a higher risk for hepatitis B, you should be screened for this virus. Talk with your health care provider to find out if you are at risk for hepatitis B infection. Hepatitis C Blood testing is recommended for: Everyone born from 1945 through 1965. Anyone with known risk factors for hepatitis C. Sexually transmitted infections (STIs) You should be screened each year for STIs, including gonorrhea and chlamydia, if: You are sexually active and are younger than 78 years of age. You are older than 78 years of age and your   health care provider tells you that you are at risk for this type of infection. Your sexual activity has changed since you were last screened, and you are at increased risk for chlamydia or gonorrhea. Ask your health care provider if you are at risk. Ask your health care provider about whether you are at high risk for HIV. Your health care provider  may recommend a prescription medicine to help prevent HIV infection. If you choose to take medicine to prevent HIV, you should first get tested for HIV. You should then be tested every 3 months for as long as you are taking the medicine. Follow these instructions at home: Alcohol use Do not drink alcohol if your health care provider tells you not to drink. If you drink alcohol: Limit how much you have to 0-2 drinks a day. Know how much alcohol is in your drink. In the [place] one drink equals one 12 oz bottle of beer (355 mL), one 5 oz glass of wine (148 mL), or one 1 oz glass of hard liquor (44 mL). Lifestyle Do not use any products that contain nicotine or tobacco. These products include cigarettes, chewing tobacco, and vaping devices, such as e-cigarettes. If you need help quitting, ask your health care provider. Do not use street drugs. Do not share needles. Ask your health care provider for help if you need support or information about quitting drugs. General instructions Schedule regular health, dental, and eye exams. Stay current with your vaccines. Tell your health care provider if: You often feel depressed. You have ever been abused or do not feel safe at home. Summary Adopting a healthy lifestyle and getting preventive care are important in promoting health and wellness. Follow your health care provider's instructions about healthy diet, exercising, and getting tested or screened for diseases. Follow your health care provider's instructions on monitoring your cholesterol and blood pressure. This information is not intended to replace advice given to you by your health care provider. Make sure you discuss any questions you have with your health care provider. Document Revised: 07/21/2020 Document Reviewed: 07/21/2020 Elsevier Patient Education  2023 Elsevier Inc.  

## 2022-04-22 NOTE — Progress Notes (Signed)
I attempted to contact the patient for his Medicare Wellness Visit, no answer. I left a detail message on his personal voicemail.

## 2022-04-23 ENCOUNTER — Ambulatory Visit (INDEPENDENT_AMBULATORY_CARE_PROVIDER_SITE_OTHER): Payer: Medicare PPO

## 2022-04-23 DIAGNOSIS — Z0000 Encounter for general adult medical examination without abnormal findings: Secondary | ICD-10-CM

## 2022-05-05 ENCOUNTER — Telehealth: Payer: Self-pay | Admitting: Family Medicine

## 2022-05-05 NOTE — Telephone Encounter (Signed)
[place] to schedule their annual wellness visit. Appointment made for 05/14/2022 at 11:00AM.  Holli Humbles

## 2022-05-13 NOTE — Patient Instructions (Signed)
Health Maintenance, Male Adopting a healthy lifestyle and getting preventive care are important in promoting health and wellness. Ask your health care provider about: The right schedule for you to have regular tests and exams. Things you can do on your own to prevent diseases and keep yourself healthy. What should I know about diet, weight, and exercise? Eat a healthy diet  Eat a diet that includes plenty of vegetables, fruits, low-fat dairy products, and lean protein. Do not eat a lot of foods that are high in solid fats, added sugars, or sodium. Maintain a healthy weight Body mass index (BMI) is a measurement that can be used to identify possible weight problems. It estimates body fat based on height and weight. Your health care provider can help determine your BMI and help you achieve or maintain a healthy weight. Get regular exercise Get regular exercise. This is one of the most important things you can do for your health. Most adults should: Exercise for at least 150 minutes each week. The exercise should increase your heart rate and make you sweat (moderate-intensity exercise). Do strengthening exercises at least twice a week. This is in addition to the moderate-intensity exercise. Spend less time sitting. Even light physical activity can be beneficial. Watch cholesterol and blood lipids Have your blood tested for lipids and cholesterol at 78 years of age, then have this test every 5 years. You may need to have your cholesterol levels checked more often if: Your lipid or cholesterol levels are high. You are older than 78 years of age. You are at high risk for heart disease. What should I know about cancer screening? Many types of cancers can be detected early and may often be prevented. Depending on your health history and family history, you may need to have cancer screening at various ages. This may include screening for: Colorectal cancer. Prostate cancer. Skin cancer. Lung  cancer. What should I know about heart disease, diabetes, and high blood pressure? Blood pressure and heart disease High blood pressure causes heart disease and increases the risk of stroke. This is more likely to develop in people who have high blood pressure readings or are overweight. Talk with your health care provider about your target blood pressure readings. Have your blood pressure checked: Every 3-5 years if you are 18-39 years of age. Every year if you are 40 years old or older. If you are between the ages of 65 and 75 and are a current or former smoker, ask your health care provider if you should have a one-time screening for abdominal aortic aneurysm (AAA). Diabetes Have regular diabetes screenings. This checks your fasting blood sugar level. Have the screening done: Once every three years after age 45 if you are at a normal weight and have a low risk for diabetes. More often and at a younger age if you are overweight or have a high risk for diabetes. What should I know about preventing infection? Hepatitis B If you have a higher risk for hepatitis B, you should be screened for this virus. Talk with your health care provider to find out if you are at risk for hepatitis B infection. Hepatitis C Blood testing is recommended for: Everyone born from 1945 through 1965. Anyone with known risk factors for hepatitis C. Sexually transmitted infections (STIs) You should be screened each year for STIs, including gonorrhea and chlamydia, if: You are sexually active and are younger than 78 years of age. You are older than 78 years of age and your   health care provider tells you that you are at risk for this type of infection. Your sexual activity has changed since you were last screened, and you are at increased risk for chlamydia or gonorrhea. Ask your health care provider if you are at risk. Ask your health care provider about whether you are at high risk for HIV. Your health care provider  may recommend a prescription medicine to help prevent HIV infection. If you choose to take medicine to prevent HIV, you should first get tested for HIV. You should then be tested every 3 months for as long as you are taking the medicine. Follow these instructions at home: Alcohol use Do not drink alcohol if your health care provider tells you not to drink. If you drink alcohol: Limit how much you have to 0-2 drinks a day. Know how much alcohol is in your drink. In the [place] one drink equals one 12 oz bottle of beer (355 mL), one 5 oz glass of wine (148 mL), or one 1 oz glass of hard liquor (44 mL). Lifestyle Do not use any products that contain nicotine or tobacco. These products include cigarettes, chewing tobacco, and vaping devices, such as e-cigarettes. If you need help quitting, ask your health care provider. Do not use street drugs. Do not share needles. Ask your health care provider for help if you need support or information about quitting drugs. General instructions Schedule regular health, dental, and eye exams. Stay current with your vaccines. Tell your health care provider if: You often feel depressed. You have ever been abused or do not feel safe at home. Summary Adopting a healthy lifestyle and getting preventive care are important in promoting health and wellness. Follow your health care provider's instructions about healthy diet, exercising, and getting tested or screened for diseases. Follow your health care provider's instructions on monitoring your cholesterol and blood pressure. This information is not intended to replace advice given to you by your health care provider. Make sure you discuss any questions you have with your health care provider. Document Revised: 07/21/2020 Document Reviewed: 07/21/2020 Elsevier Patient Education  2023 Elsevier Inc.  

## 2022-05-13 NOTE — Progress Notes (Signed)
I connected with  Wyline Copas on 05/14/2022 by a audio enabled telemedicine application and verified that I am speaking with the correct person using two identifiers.  Patient Location: Home  Provider Location: Home Office  I discussed the limitations of evaluation and management by telemedicine. The patient expressed understanding and agreed to proceed.  Subjective:   John Chung is a 77 y.o. male who presents for an Initial Medicare Annual Wellness Visit.  Review of Systems    Per HPI unless specifically indicated below. Cardiac Risk Factors include: advanced age (>66mn, >>13women);male gender and Hypercholesterolemia.           Objective:       03/18/2021   11:18 AM 02/19/2021   11:41 AM 06/13/2019    2:07 PM  Vitals with BMI  Height   '5\' 8"'$   Weight 180 lbs 3 oz 178 lbs 179 lbs 10 oz  BMI   [id number] Systolic [id number] Diastolic 60 60 72  Pulse 74 82 61    There were no vitals filed for this visit. There is no height or weight on file to calculate BMI.     05/14/2022   11:15 AM 03/18/2021   11:17 AM 02/19/2021   11:43 AM 06/13/2019    2:08 PM 08/31/2017    2:26 PM 07/21/2016    2:57 PM 04/14/2016    8:40 AM  Advanced Directives  Does Patient Have a Medical Advance Directive? No No No No No Yes Yes  Type of ATeacher, early years/preLiving will [place] will  Copy of [place]?      No - copy requested No - copy requested  Would patient like information on creating a medical advance directive? No - Patient declined No - Patient declined No - Patient declined No - Patient declined No - Patient declined      Current Medications (verified) Outpatient Encounter Medications as of 05/14/2022  Medication Sig   ketoconazole (NIZORAL) 2 % cream Apply topically daily.   Multiple Vitamins-Minerals (PRESERVISION AREDS 2) CAPS Take 1 capsule by mouth daily.   simvastatin (ZOCOR) 40 MG tablet TAKE  ONE TABLET BY MOUTH DAILY AT BEDTIME   No facility-administered encounter medications on file as of 05/14/2022.    Allergies (verified) Patient has no known allergies.   History: Past Medical History:  Diagnosis Date   Elevated cholesterol    Low back pain    Past Surgical History:  Procedure Laterality Date   ESOPHAGOGASTRODUODENOSCOPY     bleeding ulcer   Family History  Problem Relation Age of Onset   Colon cancer Neg Hx    Pancreatic cancer Neg Hx    Rectal cancer Neg Hx    Stomach cancer Neg Hx    Social History   Socioeconomic History   Marital status: Married    Spouse name: Not on file   Number of children: 1   Years of education: Not on file   Highest education level: Not on file  Occupational History   Occupation: Retired  Tobacco Use   Smoking status: Former   Smokeless tobacco: Former    Quit date: 03/30/1981  Vaping Use   Vaping Use: Never used  Substance and Sexual Activity   Alcohol use: Not Currently    Comment: beer occas.   Drug use: No   Sexual activity: Yes    Birth control/protection: Other-see comments  Comment: partner post menopausal  Other Topics Concern   Not on file  Social History Narrative   Not on file   Social Determinants of Health   Financial Resource Strain: Low Risk  (05/14/2022)   Overall Financial Resource Strain (CARDIA)    Difficulty of Paying Living Expenses: Not hard at all  Food Insecurity: No Food Insecurity (05/14/2022)   Hunger Vital Sign    Worried About Running Out of Food in the Last Year: Never true    Ran Out of Food in the Last Year: Never true  Transportation Needs: No Transportation Needs (05/14/2022)   PRAPARE - Hydrologist (Medical): No    Lack of Transportation (Non-Medical): No  Physical Activity: Sufficiently Active (05/14/2022)   Exercise Vital Sign    Days of Exercise per Week: 7 days    Minutes of Exercise per Session: 60 min  Stress: No Stress Concern Present  (05/14/2022)   [place]    Feeling of Stress : Not at all  Social Connections: Moderately Isolated (05/14/2022)   Social Connection and Isolation Panel [NHANES]    Frequency of Communication with Friends and Family: Twice a week    Frequency of Social Gatherings with Friends and Family: More than three times a week    Attends Religious Services: Never    Marine scientist or Organizations: No    Attends Music therapist: Never    Marital Status: Married    Tobacco Counseling Counseling given: No   Clinical Intake:  Pre-visit preparation completed: No  Pain : No/denies pain     Nutritional Status: BMI of 19-24  Normal Nutritional Risks: None Diabetes: No  How often do you need to have someone help you when you read instructions, pamphlets, or other written materials from your doctor or pharmacy?: 1 - Never  Diabetic?No  Interpreter Needed?: No  Information entered by :: Donnie Mesa, CMA   Activities of Daily Living    05/14/2022   11:00 AM  In your present state of health, do you have any difficulty performing the following activities:  Hearing? 1  Comment hearing aids  Vision? 1  Comment Dr. Harrington Challenger  Difficulty concentrating or making decisions? 0  Walking or climbing stairs? 0  Dressing or bathing? 0  Doing errands, shopping? 0    Patient Care Team: Kinnie Feil, MD as PCP - General (Family Medicine)  Indicate any recent Medical Services you may have received from other than [facility] providers in the past year (date may be approximate).     Assessment:   This is a routine wellness examination for John Chung.  Hearing/Vision screen Sensory Hearing loss unilateral. Wear hearing aids. Macular degeneration. Wear glasses. Annual Eye Exam Dr. Harrington Challenger.   Dietary issues and exercise activities discussed: Current Exercise Habits: Home exercise routine;Structured exercise class, Type of  exercise: yoga;walking, Time (Minutes): 60, Frequency (Times/Week): 7, Weekly Exercise (Minutes/Week): 420, Intensity: Moderate, Exercise limited by: None identified   Goals Addressed   None    Depression Screen    05/14/2022   11:00 AM 03/18/2021   11:29 AM 02/19/2021   11:43 AM 06/13/2019    2:08 PM 08/31/2017    2:26 PM 07/21/2016    2:59 PM 06/29/2016    1:46 PM  PHQ 2/9 Scores  PHQ - 2 Score 0 0 0 0 0 0 0  PHQ- 9 Score  1 0  Fall Risk    05/14/2022   11:00 AM 06/13/2019    2:08 PM 08/31/2017    2:26 PM 07/21/2016    2:59 PM 06/29/2016    1:46 PM  [place] in the past year? 0 0 No No No  Number falls in past yr: 0 1     Injury with Fall? 0 1     Risk for fall due to : No Fall Risks      Follow up Falls evaluation completed        [place]:  Any stairs in or around the home? Yes  If so, are there any without handrails? No  Home free of loose throw rugs in walkways, pet beds, electrical cords, etc? Yes  Adequate lighting in your home to reduce risk of falls? Yes   ASSISTIVE DEVICES UTILIZED TO PREVENT FALLS:  Life alert? No  Use of a cane, walker or w/c? No  Grab bars in the bathroom? Yes  Shower chair or bench in shower? No  Elevated toilet seat or a handicapped toilet? No   TIMED UP AND GO:  Was the test performed? Marland KitchenUnable to perform, virtual appointment   Cognitive Function:        05/14/2022   11:02 AM  6CIT Screen  What Year? 0 points  What month? 0 points  What time? 0 points  Count back from 20 0 points  Months in reverse 0 points  Repeat phrase 0 points  Total Score 0 points    Immunizations Immunization History  Administered Date(s) Administered   Influenza Whole 02/13/2007, 01/15/2008, 01/15/2009, 01/09/2010   Influenza,inj,Quad PF,6+ Mos 01/02/2013   Influenza-Unspecified 01/07/2016   PFIZER(Purple Top)SARS-COV-2 Vaccination 04/22/2019, 05/16/2019, 12/22/2019, 06/16/2020   Pfizer Covid-19 Vaccine  Bivalent Booster 54yr & up 11/26/2020   Pneumococcal Conjugate-13 06/27/2013   Pneumococcal Polysaccharide-23 12/13/2005, 03/31/2011   Td 04/16/1999, 02/28/2009   Tdap 02/19/2021   Zoster, Live 12/22/2005    TDAP status: Up to date  Flu Vaccine status: Due, Education has been provided regarding the importance of this vaccine. Advised may receive this vaccine at local pharmacy or Health Dept. Aware to provide a copy of the vaccination record if obtained from local pharmacy or Health Dept. Verbalized acceptance and understanding.  Pneumococcal vaccine status: Completed during today's visit.  Covid-19 vaccine status: Information provided on how to obtain vaccines.   Qualifies for Shingles Vaccine? Yes   Zostavax completed No   Shingrix Completed?: No.    Education has been provided regarding the importance of this vaccine. Patient has been advised to call insurance company to determine out of pocket expense if they have not yet received this vaccine. Advised may also receive vaccine at local pharmacy or Health Dept. Verbalized acceptance and understanding.  Screening Tests Health Maintenance  Topic Date Due   Zoster Vaccines- Shingrix (1 of 2) Never done   INFLUENZA VACCINE  10/13/2021   COVID-19 Vaccine (6 - 2023-24 season) 11/13/2021   Medicare Annual Wellness (AWV)  05/14/2023   DTaP/Tdap/Td (4 - Td or Tdap) 02/20/2031   Pneumonia Vaccine 77 Years old  Completed   Hepatitis C Screening  Completed   HPV VACCINES  Aged Out   COLONOSCOPY (Pts 45-475yrInsurance coverage will need to be confirmed)  Discontinued    Health Maintenance  Health Maintenance Due  Topic Date Due   Zoster Vaccines- Shingrix (1 of 2) Never done   INFLUENZA VACCINE  10/13/2021  COVID-19 Vaccine (6 - 2023-24 season) 11/13/2021    Colorectal cancer screening: No longer required.   Lung Cancer Screening: (Low Dose CT Chest recommended if Age 52-80 years, 30 pack-year currently smoking OR have quit w/in  15years.) does not qualify.   Lung Cancer Screening Referral: not applicable   Additional Screening:  Hepatitis C Screening: does qualify; Completed 05/30/2013  Vision Screening: Recommended annual ophthalmology exams for early detection of glaucoma and other disorders of the eye. Is the patient up to date with their annual eye exam?  Yes  Who is the provider or what is the name of the office in which the patient attends annual eye exams? Dr. Harrington Challenger If pt is not established with a provider, would they like to be referred to a provider to establish care? No .   Dental Screening: Recommended annual dental exams for proper oral hygiene  Community Resource Referral / Chronic Care Management: CRR required this visit?  No   CCM required this visit?  No      Plan:     I have personally reviewed and noted the following in the patient's chart:   Medical and social history Use of alcohol, tobacco or illicit drugs  Current medications and supplements including opioid prescriptions. Patient is not currently taking opioid prescriptions. Functional ability and status Nutritional status Physical activity Advanced directives List of other physicians Hospitalizations, surgeries, and ER visits in previous 12 months Vitals Screenings to include cognitive, depression, and falls Referrals and appointments  In addition, I have reviewed and discussed with patient certain preventive protocols, quality metrics, and best practice recommendations. A written personalized care plan for preventive services as well as general preventive health recommendations were provided to patient.    John Chung , Thank you for taking time to come for your Medicare Wellness Visit. I appreciate your ongoing commitment to your health goals. Please review the following plan we discussed and let me know if I can assist you in the future.   These are the goals we discussed:  Goals   None     This is a list of the  screening recommended for you and due dates:  Health Maintenance  Topic Date Due   Zoster (Shingles) Vaccine (1 of 2) Never done   Flu Shot  10/13/2021   COVID-19 Vaccine (6 - 2023-24 season) 11/13/2021   Medicare Annual Wellness Visit  05/14/2023   DTaP/Tdap/Td vaccine (4 - Td or Tdap) 02/20/2031   Pneumonia Vaccine  Completed   Hepatitis C Screening: USPSTF Recommendation to screen - Ages 65-79 yo.  Completed   HPV Vaccine  Aged Out   Colon Cancer Screening  Discontinued    Wilson Singer, [facility]   05/14/2022  Nurse Notes: Approximately 30 minute Non-Face -To-Face Medicare Wellness Visit . The patient requested scheduling an Annual Physical with his new assigned provider for maintenance lab work. Appointment scheduled

## 2022-05-14 ENCOUNTER — Ambulatory Visit (INDEPENDENT_AMBULATORY_CARE_PROVIDER_SITE_OTHER): Payer: Medicare PPO

## 2022-05-14 DIAGNOSIS — Z0000 Encounter for general adult medical examination without abnormal findings: Secondary | ICD-10-CM

## 2022-05-28 ENCOUNTER — Ambulatory Visit (INDEPENDENT_AMBULATORY_CARE_PROVIDER_SITE_OTHER): Payer: Medicare PPO | Admitting: Family Medicine

## 2022-05-28 ENCOUNTER — Encounter: Payer: Self-pay | Admitting: Family Medicine

## 2022-05-28 VITALS — BP 137/75 | HR 63 | Wt 176.2 lb

## 2022-05-28 DIAGNOSIS — E785 Hyperlipidemia, unspecified: Secondary | ICD-10-CM

## 2022-05-28 DIAGNOSIS — Z23 Encounter for immunization: Secondary | ICD-10-CM

## 2022-05-28 DIAGNOSIS — Z0000 Encounter for general adult medical examination without abnormal findings: Secondary | ICD-10-CM | POA: Diagnosis not present

## 2022-05-28 DIAGNOSIS — R351 Nocturia: Secondary | ICD-10-CM

## 2022-05-28 DIAGNOSIS — Z125 Encounter for screening for malignant neoplasm of prostate: Secondary | ICD-10-CM | POA: Diagnosis not present

## 2022-05-28 NOTE — Patient Instructions (Signed)
It was nice meeting you today. Your physical exam went well. Please get your Shingrix/shingles shot at the pharmacy. I will call with your test results soon.

## 2022-05-28 NOTE — Progress Notes (Signed)
Subjective:     John Chung is a 77 y.o. male and is here for a comprehensive physical exam. The patient reports problems - want to lose weight. He is exercising. Will like to cut back on high calorie diet. He will like to get down to 160s.  Social History   Socioeconomic History   Marital status: Married    Spouse name: Not on file   Number of children: 1   Years of education: Not on file   Highest education level: Not on file  Occupational History   Occupation: Retired  Tobacco Use   Smoking status: Former   Smokeless tobacco: Former    Quit date: 03/30/1981  Vaping Use   Vaping Use: Never used  Substance and Sexual Activity   Alcohol use: Not Currently    Comment: beer occas.   Drug use: No   Sexual activity: Yes    Birth control/protection: Other-see comments    Comment: partner post menopausal  Other Topics Concern   Not on file  Social History Narrative   Not on file   Social Determinants of Health   Financial Resource Strain: Low Risk  (05/14/2022)   Overall Financial Resource Strain (CARDIA)    Difficulty of Paying Living Expenses: Not hard at all  Food Insecurity: No Food Insecurity (05/14/2022)   Hunger Vital Sign    Worried About Running Out of Food in the Last Year: Never true    Ran Out of Food in the Last Year: Never true  Transportation Needs: No Transportation Needs (05/14/2022)   PRAPARE - Hydrologist (Medical): No    Lack of Transportation (Non-Medical): No  Physical Activity: Sufficiently Active (05/14/2022)   Exercise Vital Sign    Days of Exercise per Week: 7 days    Minutes of Exercise per Session: 60 min  Stress: No Stress Concern Present (05/14/2022)   [place]    Feeling of Stress : Not at all  Social Connections: Moderately Isolated (05/14/2022)   Social Connection and Isolation Panel [NHANES]    Frequency of Communication with Friends and Family:  Twice a week    Frequency of Social Gatherings with Friends and Family: More than three times a week    Attends Religious Services: Never    Marine scientist or Organizations: No    Attends Archivist Meetings: Never    Marital Status: Married  Human resources officer Violence: Not At Risk (05/14/2022)   Humiliation, Afraid, Rape, and Kick questionnaire    Fear of Current or Ex-Partner: No    Emotionally Abused: No    Physically Abused: No    Sexually Abused: No   Health Maintenance  Topic Date Due   Zoster Vaccines- Shingrix (1 of 2) Never done   COVID-19 Vaccine (7 - 2023-24 season) 03/17/2022   Medicare Annual Wellness (AWV)  05/14/2023   DTaP/Tdap/Td (4 - Td or Tdap) 02/20/2031   Pneumonia Vaccine 78+ Years old  Completed   INFLUENZA VACCINE  Completed   Hepatitis C Screening  Completed   HPV VACCINES  Aged Out   COLONOSCOPY (Pts 45-22yrs Insurance coverage will need to be confirmed)  Discontinued    The following portions of the patient's history were reviewed and updated as appropriate: allergies, current medications, past family history, past medical history, past social history, past surgical history, and problem list.  Review of Systems Pertinent items noted in HPI and remainder  of comprehensive ROS otherwise negative.   Objective:    BP 137/75   Pulse 63   Wt 176 lb 3.2 oz (79.9 kg)   SpO2 100%   BMI 26.79 kg/m  General appearance: alert and cooperative Head: Normocephalic, without obvious abnormality, atraumatic Eyes: conjunctivae/corneas clear. PERRL, EOM's intact. Fundi benign. Ears: normal TM's and external ear canals both ears Throat: lips, mucosa, and tongue normal; teeth and gums normal Neck: no adenopathy, no carotid bruit, no JVD, supple, symmetrical, trachea midline, and thyroid not enlarged, symmetric, no tenderness/mass/nodules Lungs: clear to auscultation bilaterally Heart: regular rate and rhythm, S1, S2 normal, no murmur, click, rub or  gallop Abdomen: soft, non-tender; bowel sounds normal; no masses,  no organomegaly Extremities: extremities normal, atraumatic, no cyanosis or edema Pulses: 2+ and symmetric Lymph nodes: Cervical, supraclavicular, and axillary nodes normal. Neurologic: Alert and oriented X 3, normal strength and tone. Normal symmetric reflexes. Normal coordination and gait    Assessment:    Healthy male exam.    Plan:  Generally healthy. Up to date with colon cancer screening. Interested in prostate Ca screening. Wakes up at nighttime to urinate. PSA checked. Cmet and FLP checked for his HLD. Covid-19 shot given. Otherwise, generally well with no acute findings.   See After Visit Summary for Counseling Recommendations  Andrena Mews, MD [place]

## 2022-05-29 LAB — CMP14+EGFR
ALT: 21 IU/L (ref 0–44)
AST: 17 IU/L (ref 0–40)
Albumin/Globulin Ratio: 1.6 (ref 1.2–2.2)
Albumin: 4.4 g/dL (ref 3.8–4.8)
Alkaline Phosphatase: 92 IU/L (ref 44–121)
BUN/Creatinine Ratio: 17 (ref 10–24)
BUN: 18 mg/dL (ref 8–27)
Bilirubin Total: 0.3 mg/dL (ref 0.0–1.2)
CO2: 24 mmol/L (ref 20–29)
Calcium: 9.2 mg/dL (ref 8.6–10.2)
Chloride: 102 mmol/L (ref 96–106)
Creatinine, Ser: 1.08 mg/dL (ref 0.76–1.27)
Globulin, Total: 2.8 g/dL (ref 1.5–4.5)
Glucose: 99 mg/dL (ref 70–99)
Potassium: 4.7 mmol/L (ref 3.5–5.2)
Sodium: 138 mmol/L (ref 134–144)
Total Protein: 7.2 g/dL (ref 6.0–8.5)
eGFR: 71 mL/min/{1.73_m2} (ref >59)

## 2022-05-29 LAB — PSA, TOTAL AND FREE
PSA, Free Pct: 19 %
PSA, Free: 0.38 ng/mL
Prostate Specific Ag, Serum: 2 ng/mL (ref 0.0–4.0)

## 2022-05-29 LAB — LIPID PANEL
Chol/HDL Ratio: 3.2 ratio (ref 0.0–5.0)
Cholesterol, Total: 144 mg/dL (ref 100–199)
HDL: 45 mg/dL (ref >39)
LDL Chol Calc (NIH): 84 mg/dL (ref 0–99)
Triglycerides: 79 mg/dL (ref 0–149)
VLDL Cholesterol Cal: 15 mg/dL (ref 5–40)

## 2022-07-19 ENCOUNTER — Other Ambulatory Visit: Payer: Self-pay | Admitting: Family Medicine

## 2022-07-19 DIAGNOSIS — E7800 Pure hypercholesterolemia, unspecified: Secondary | ICD-10-CM

## 2022-10-26 ENCOUNTER — Other Ambulatory Visit: Payer: Self-pay | Admitting: Family Medicine

## 2022-10-26 DIAGNOSIS — E7800 Pure hypercholesterolemia, unspecified: Secondary | ICD-10-CM

## 2023-04-15 DIAGNOSIS — B078 Other viral warts: Secondary | ICD-10-CM | POA: Diagnosis not present

## 2023-04-15 DIAGNOSIS — C44319 Basal cell carcinoma of skin of other parts of face: Secondary | ICD-10-CM | POA: Diagnosis not present

## 2023-04-15 DIAGNOSIS — X32XXXD Exposure to sunlight, subsequent encounter: Secondary | ICD-10-CM | POA: Diagnosis not present

## 2023-04-15 DIAGNOSIS — L570 Actinic keratosis: Secondary | ICD-10-CM | POA: Diagnosis not present

## 2023-05-27 DIAGNOSIS — B078 Other viral warts: Secondary | ICD-10-CM | POA: Diagnosis not present

## 2023-05-27 DIAGNOSIS — L570 Actinic keratosis: Secondary | ICD-10-CM | POA: Diagnosis not present

## 2023-05-27 DIAGNOSIS — L820 Inflamed seborrheic keratosis: Secondary | ICD-10-CM | POA: Diagnosis not present

## 2023-05-27 DIAGNOSIS — X32XXXD Exposure to sunlight, subsequent encounter: Secondary | ICD-10-CM | POA: Diagnosis not present

## 2023-06-13 ENCOUNTER — Encounter: Payer: Self-pay | Admitting: Family Medicine

## 2023-06-13 ENCOUNTER — Ambulatory Visit: Admitting: Family Medicine

## 2023-06-13 ENCOUNTER — Ambulatory Visit: Payer: Medicare PPO

## 2023-06-13 VITALS — BP 102/62 | HR 60 | Ht 68.0 in | Wt 173.4 lb

## 2023-06-13 VITALS — BP 102/62 | HR 60 | Ht 68.0 in | Wt 175.0 lb

## 2023-06-13 DIAGNOSIS — Z8042 Family history of malignant neoplasm of prostate: Secondary | ICD-10-CM

## 2023-06-13 DIAGNOSIS — E7800 Pure hypercholesterolemia, unspecified: Secondary | ICD-10-CM

## 2023-06-13 DIAGNOSIS — Z0000 Encounter for general adult medical examination without abnormal findings: Secondary | ICD-10-CM

## 2023-06-13 DIAGNOSIS — Z833 Family history of diabetes mellitus: Secondary | ICD-10-CM

## 2023-06-13 LAB — POCT GLYCOSYLATED HEMOGLOBIN (HGB A1C): Hemoglobin A1C: 5.7 % — AB (ref 4.0–5.6)

## 2023-06-13 NOTE — Patient Instructions (Addendum)
 It was great to see you!  Our plans for today:  - Send your health care advanced directives (health care power of attorney or living will) to [place] via MyChart or bring a copy by the clinic whenever is good for you.  - Get your shingles vaccine from your pharmacy.  - Come back next year for your next physical, sooner if you need [place]!  We are checking some labs today, we will release these results to your MyChart.  Take care and seek immediate care sooner if you develop any concerns.   Dr. Linwood Dibbles   Preventive Care 68 Years and Older, Male Preventive care refers to lifestyle choices and visits with your health care provider that can promote health and wellness. Preventive care visits are also called wellness exams. What can I expect for my preventive care visit? Counseling During your preventive care visit, your health care provider may ask about your: Medical history, including: Past medical problems. Family medical history. History of falls. Current health, including: Emotional well-being. Home life and relationship well-being. Sexual activity. Memory and ability to understand (cognition). Lifestyle, including: Alcohol, nicotine or tobacco, and drug use. Access to firearms. Diet, exercise, and sleep habits. Work and work Astronomer. Sunscreen use. Safety issues such as seatbelt and bike helmet use. Physical exam Your health care provider will check your: Height and weight. These may be used to calculate your BMI (body mass index). BMI is a measurement that tells if you are at a healthy weight. Waist circumference. This measures the distance around your waistline. This measurement also tells if you are at a healthy weight and may help predict your risk of certain diseases, such as type 2 diabetes and high blood pressure. Heart rate and blood pressure. Body temperature. Skin for abnormal spots. What immunizations do I need?  Vaccines are usually given at various ages, according  to a schedule. Your health care provider will recommend vaccines for you based on your age, medical history, and lifestyle or other factors, such as travel or where you work. What tests do I need? Screening Your health care provider may recommend screening tests for certain conditions. This may include: Lipid and cholesterol levels. Diabetes screening. This is done by checking your blood sugar (glucose) after you have not eaten for a while (fasting). Hepatitis C test. Hepatitis B test. HIV (human immunodeficiency virus) test. STI (sexually transmitted infection) testing, if you are at risk. Lung cancer screening. Colorectal cancer screening. Prostate cancer screening. Abdominal aortic aneurysm (AAA) screening. You may need this if you are a current or former smoker. Talk with your health care provider about your test results, treatment options, and if necessary, the need for more tests. Follow these instructions at home: Eating and drinking  Eat a diet that includes fresh fruits and vegetables, whole grains, lean protein, and low-fat dairy products. Limit your intake of foods with high amounts of sugar, saturated fats, and salt. Take vitamin and mineral supplements as recommended by your health care provider. Do not drink alcohol if your health care provider tells you not to drink. If you drink alcohol: Limit how much you have to 0-2 drinks a day. Know how much alcohol is in your drink. In the [place] one drink equals one 12 oz bottle of beer (355 mL), one 5 oz glass of wine (148 mL), or one 1 oz glass of hard liquor (44 mL). Lifestyle Brush your teeth every morning and night with fluoride toothpaste. Floss one time each day. Exercise for at  least 30 minutes 5 or more days each week. Do not use any products that contain nicotine or tobacco. These products include cigarettes, chewing tobacco, and vaping devices, such as e-cigarettes. If you need help quitting, ask your health care  provider. Do not use drugs. If you are sexually active, practice safe sex. Use a condom or other form of protection to prevent STIs. Take aspirin only as told by your health care provider. Make sure that you understand how much to take and what form to take. Work with your health care provider to find out whether it is safe and beneficial for you to take aspirin daily. Ask your health care provider if you need to take a cholesterol-lowering medicine (statin). Find healthy ways to manage stress, such as: Meditation, yoga, or listening to music. Journaling. Talking to a trusted person. Spending time with friends and family. Safety Always wear your seat belt while driving or riding in a vehicle. Do not drive: If you have been drinking alcohol. Do not ride with someone who has been drinking. When you are tired or distracted. While texting. If you have been using any mind-altering substances or drugs. Wear a helmet and other protective equipment during sports activities. If you have firearms in your house, make sure you follow all gun safety procedures. Minimize exposure to UV radiation to reduce your risk of skin cancer. What's next? Visit your health care provider once a year for an annual wellness visit. Ask your health care provider how often you should have your eyes and teeth checked. Stay up to date on all vaccines. This information is not intended to replace advice given to you by your health care provider. Make sure you discuss any questions you have with your health care provider. Document Revised: 08/27/2020 Document Reviewed: 08/27/2020 Elsevier Patient Education  2024 [employer].

## 2023-06-13 NOTE — Progress Notes (Signed)
 Because this visit was a virtual/telehealth visit,  certain criteria was not obtained, such a blood pressure, CBG if applicable, and timed get up and go. Any medications not marked as "taking" were not mentioned during the medication reconciliation part of the visit. Any vitals not documented were not able to be obtained due to this being a telehealth visit or patient was unable to self-report a recent blood pressure reading due to a lack of equipment at home via telehealth. Vitals that have been documented are verbally provided by the patient.   Subjective:   John Chung is a 78 y.o. who presents for a Medicare Wellness preventive visit.  Visit Complete: Virtual I connected with  John Chung on 06/13/23 by a audio enabled telemedicine application and verified that I am speaking with the correct person using two identifiers.  Patient Location: Home  Provider Location: Office/Clinic  I discussed the limitations of evaluation and management by telemedicine. The patient expressed understanding and agreed to proceed.  Vital Signs: Because this visit was a virtual/telehealth visit, some criteria may be missing or patient reported. Any vitals not documented were not able to be obtained and vitals that have been documented are patient reported.  VideoDeclined- This patient declined Librarian, academic. Therefore the visit was completed with audio only.  Persons Participating in Visit: Patient.  AWV Questionnaire: No: Patient Medicare AWV questionnaire was not completed prior to this visit.  Cardiac Risk Factors include: advanced age (>15men, >35 women);dyslipidemia;male gender     Objective:    Today's Vitals   06/13/23 1112  BP: 102/62  Pulse: 60  SpO2: 98%  Weight: 173 lb 6.4 oz (78.7 kg)  Height: 5\' 8"  (1.727 m)  PainSc: 0-No pain   Body mass index is 26.37 kg/m.     06/13/2023   11:14 AM 06/13/2023    9:46 AM 05/14/2022   11:15 AM 03/18/2021    11:17 AM 02/19/2021   11:43 AM 06/13/2019    2:08 PM 08/31/2017    2:26 PM  Advanced Directives  Does Patient Have a Medical Advance Directive? Yes Yes No No No No No  Type of Estate agent of [place] [employer];Living will Healthcare Power of Short;Living will       Does patient want to make changes to medical advance directive?  No - Patient declined       Would patient like information on creating a medical advance directive?   No - Patient declined No - Patient declined No - Patient declined No - Patient declined No - Patient declined    Current Medications (verified) Outpatient Encounter Medications as of 06/13/2023  Medication Sig   Multiple Vitamins-Minerals (PRESERVISION AREDS 2) CAPS Take 1 capsule by mouth daily.   simvastatin (ZOCOR) 40 MG tablet TAKE ONE TABLET BY MOUTH DAILY AT BEDTIME   No facility-administered encounter medications on file as of 06/13/2023.    Allergies (verified) Patient has no known allergies.   History: Past Medical History:  Diagnosis Date   Elevated cholesterol    Low back pain    Past Surgical History:  Procedure Laterality Date   ESOPHAGOGASTRODUODENOSCOPY     bleeding ulcer   Family History  Problem Relation Age of Onset   Colon cancer Neg Hx    Pancreatic cancer Neg Hx    Rectal cancer Neg Hx    Stomach cancer Neg Hx    Social History   Socioeconomic History   Marital status: Married  Spouse name: Not on file   Number of children: 1   Years of education: Not on file   Highest education level: Not on file  Occupational History   Occupation: Retired  Tobacco Use   Smoking status: Former    Passive exposure: Past   Smokeless tobacco: Former    Quit date: 03/30/1981  Vaping Use   Vaping status: Never Used  Substance and Sexual Activity   Alcohol use: Not Currently    Comment: beer occas.   Drug use: No   Sexual activity: Yes    Birth control/protection: Other-see comments    Comment: partner post menopausal   Other Topics Concern   Not on file  Social History Narrative   Not on file   Social Drivers of Health   Financial Resource Strain: Low Risk  (06/13/2023)   Overall Financial Resource Strain (CARDIA)    Difficulty of Paying Living Expenses: Not hard at all  Food Insecurity: No Food Insecurity (06/13/2023)   Hunger Vital Sign    Worried About Running Out of Food in the Last Year: Never true    Ran Out of Food in the Last Year: Never true  Transportation Needs: No Transportation Needs (06/13/2023)   PRAPARE - Administrator, Civil Service (Medical): No    Lack of Transportation (Non-Medical): No  Physical Activity: Sufficiently Active (06/13/2023)   Exercise Vital Sign    Days of Exercise per Week: 7 days    Minutes of Exercise per Session: 60 min  Stress: No Stress Concern Present (06/13/2023)   [employer] of Occupational Health - Occupational Stress Questionnaire    Feeling of Stress : Not at all  Social Connections: Moderately Isolated (06/13/2023)   Social Connection and Isolation Panel [NHANES]    Frequency of Communication with Friends and Family: Twice a week    Frequency of Social Gatherings with Friends and Family: More than three times a week    Attends Religious Services: Never    Database administrator or Organizations: No    Attends Engineer, structural: Never    Marital Status: Married    Tobacco Counseling Counseling given: Not Answered    Clinical Intake:  Pre-visit preparation completed: Yes  Pain : No/denies pain Pain Score: 0-No pain     BMI - recorded: 26.37 Nutritional Status: BMI 25 -29 Overweight Nutritional Risks: None Diabetes: No  Lab Results  Component Value Date   HGBA1C 5.7 (A) 06/13/2023   HGBA1C 5.5 02/19/2021     How often do you need to have someone help you when you read instructions, pamphlets, or other written materials from your doctor or pharmacy?: 1 - Never  Interpreter Needed?: No  Information  entered by :: Ailin Rochford N. Savannha Welle, LPN.   Activities of Daily Living     06/13/2023   11:17 AM  In your present state of health, do you have any difficulty performing the following activities:  Hearing? 1  Vision? 0  Difficulty concentrating or making decisions? 0  Walking or climbing stairs? 0  Dressing or bathing? 0  Doing errands, shopping? 0  Preparing Food and eating ? N  Using the Toilet? N  In the past six months, have you accidently leaked urine? N  Do you have problems with loss of bowel control? N  Managing your Medications? N  Managing your Finances? N  Housekeeping or managing your Housekeeping? N    Patient Care Team: Caro Laroche, DO as PCP -  General (Family Medicine) Tobias Alexander, OD as Referring Physician (Optometry) Tobias Alexander, OD. as Consulting Physician (Optometry)  Indicate any recent Medical Services you may have received from other than [facility] providers in the past year (date may be approximate).     Assessment:   This is a routine wellness examination for John Chung.  Hearing/Vision screen Hearing Screening - Comments:: Patient has difficulty with hearing. Has hearing aids, but does not wear them. Vision Screening - Comments:: Wears rx glasses - up to date with routine eye exams with [facility]ias [facility]    Goals Addressed             This Visit's Progress    To get my body mass down a little bit.  Stay on MyFitness app and get down 160 pounds.         Depression Screen     06/13/2023   11:23 AM 06/13/2023   10:19 AM 05/28/2022    8:54 AM 05/14/2022   11:00 AM 03/18/2021   11:29 AM 02/19/2021   11:43 AM 06/13/2019    2:08 PM  PHQ 2/9 Scores  PHQ - 2 Score 0 0 0 0 0 0 0  PHQ- 9 Score 0 0 0  1 0     Fall Risk     06/13/2023   11:15 AM 06/13/2023    9:46 AM 05/14/2022   11:00 AM 06/13/2019    2:08 PM 08/31/2017    2:26 PM  Fall Risk   Falls in the past year? 0 0 0 0 No  Number falls in past yr: 0 0 0 1   Injury with Fall? 0 0 0 1    Risk for fall due to : No Fall Risks No Fall Risks No Fall Risks    Follow up Falls prevention discussed;Falls evaluation completed Falls prevention discussed Falls evaluation completed      MEDICARE RISK AT HOME:  Medicare Risk at Home Any stairs in or around the home?: No If so, are there any without handrails?: No Home free of loose throw rugs in walkways, pet beds, electrical cords, etc?: Yes Adequate lighting in your home to reduce risk of falls?: Yes Life alert?: No Use of a cane, walker or w/c?: No Grab bars in the bathroom?: Yes Shower chair or bench in shower?: No Elevated toilet seat or a handicapped toilet?: No  TIMED UP AND GO:  Was the test performed?  No  Cognitive Function: 6CIT completed    06/13/2023   11:16 AM  MMSE - Mini Mental State Exam  Not completed: Unable to complete        06/13/2023   11:16 AM 05/14/2022   11:02 AM  6CIT Screen  What Year? 0 points 0 points  What month? 0 points 0 points  What time? 0 points 0 points  Count back from 20 0 points 0 points  Months in reverse 0 points 0 points  Repeat phrase 0 points 0 points  Total Score 0 points 0 points    Immunizations Immunization History  Administered Date(s) Administered   Fluad Quad(high Dose 65+) 11/21/2018, 12/20/2021   Influenza Whole 02/13/2007, 01/15/2008, 01/15/2009, 01/09/2010   Influenza,inj,Quad PF,6+ Mos 01/02/2013   Influenza-Unspecified 01/07/2016   PFIZER(Purple Top)SARS-COV-2 Vaccination 04/22/2019, 05/16/2019, 12/22/2019, 06/16/2020   Pfizer Covid-19 Vaccine Bivalent Booster 72yrs & up 11/26/2020, 01/20/2022   Pfizer(Comirnaty)Fall Seasonal Vaccine 12 years and older 05/28/2022   Pneumococcal Conjugate-13 06/27/2013   Pneumococcal Polysaccharide-23 12/13/2005, 03/31/2011   Td 04/16/1999,  02/28/2009   Tdap 02/19/2021   Zoster, Live 12/22/2005    Screening Tests Health Maintenance  Topic Date Due   Zoster Vaccines- Shingrix (1 of 2) 07/06/1963   COVID-19  Vaccine (8 - 2024-25 season) 11/14/2022   INFLUENZA VACCINE  06/13/2023 (Originally 10/14/2022)   Medicare Annual Wellness (AWV)  06/12/2024   DTaP/Tdap/Td (4 - Td or Tdap) 02/20/2031   Pneumonia Vaccine 67+ Years old  Completed   Hepatitis C Screening  Completed   HPV VACCINES  Aged Out   Colonoscopy  Discontinued    Health Maintenance  Health Maintenance Due  Topic Date Due   Zoster Vaccines- Shingrix (1 of 2) 07/06/1963   COVID-19 Vaccine (8 - 2024-25 season) 11/14/2022   Health Maintenance Items Addressed: See Nurse Notes  Additional Screening:  Vision Screening: Recommended annual ophthalmology exams for early detection of glaucoma and other disorders of the eye.  Dental Screening: Recommended annual dental exams for proper oral hygiene  Community Resource Referral / Chronic Care Management: CRR required this visit?  No   CCM required this visit?  No     Plan:     I have personally reviewed and noted the following in the patient's chart:   Medical and social history Use of alcohol, tobacco or illicit drugs  Current medications and supplements including opioid prescriptions. Patient is not currently taking opioid prescriptions. Functional ability and status Nutritional status Physical activity Advanced directives List of other physicians Hospitalizations, surgeries, and ER visits in previous 12 months Vitals Screenings to include cognitive, depression, and falls Referrals and appointments  In addition, I have reviewed and discussed with patient certain preventive protocols, quality metrics, and best practice recommendations. A written personalized care plan for preventive services as well as general preventive health recommendations were provided to patient.     Mickeal Needy, LPN   1/61/0960   After Visit Summary: (MyChart) Due to this being a telephonic visit, the after visit summary with patients personalized plan was offered to patient via MyChart    Notes: Nothing significant to report at this time.

## 2023-06-13 NOTE — Progress Notes (Signed)
 BP 102/62   Pulse 60   Ht 5\' 8"  (1.727 m)   Wt 175 lb (79.4 kg)   SpO2 98%   BMI 26.61 kg/m    Subjective:    Patient ID: John Chung, male    DOB: 02/23/1946, 78 y.o.   MRN: [id number]  HPI: John Chung is a 78 y.o. male presenting on 06/13/2023 for comprehensive medical examination. Current medical complaints include:none  Walks 2 miles and does yoga every day. Varied diet, doesn't eat much sugar.  He currently lives with: Interim Problems from his last visit: no  Depression Screen done today and results listed below:     06/13/2023   10:19 AM 05/28/2022    8:54 AM 05/14/2022   11:00 AM 03/18/2021   11:29 AM 02/19/2021   11:43 AM  Depression screen PHQ 2/9  Decreased Interest 0 0 0 0 0  Down, Depressed, Hopeless 0 0 0 0 0  PHQ - 2 Score 0 0 0 0 0  Altered sleeping 0 0  1 0  Tired, decreased energy 0 0  0 0  Change in appetite 0 0  0 0  Feeling bad or failure about yourself  0 0  0 0  Trouble concentrating 0 0  0 0  Moving slowly or fidgety/restless 0 0  0 0  Suicidal thoughts 0 0  0 0  PHQ-9 Score 0 0  1 0  Difficult doing work/chores  Not difficult at all   Not difficult at all   Past medical history, surgical history, medications, allergies, family history and social history reviewed with patient today and changes made to appropriate areas of the chart.      Objective:    BP 102/62   Pulse 60   Ht 5\' 8"  (1.727 m)   Wt 175 lb (79.4 kg)   SpO2 98%   BMI 26.61 kg/m   Wt Readings from Last 3 Encounters:  06/13/23 175 lb (79.4 kg)  05/28/22 176 lb 3.2 oz (79.9 kg)  03/18/21 180 lb 3.2 oz (81.7 kg)    Physical Exam Constitutional:      General: He is not in acute distress.    Appearance: Normal appearance. He is not toxic-appearing.  HENT:     Head: Normocephalic and atraumatic.     Right Ear: Ear canal and external ear normal. There is impacted cerumen.     Left Ear: Ear canal and external ear normal. There is impacted cerumen.     Nose: Nose normal.      Mouth/Throat:     Mouth: Mucous membranes are moist.     Pharynx: Oropharynx is clear.  Eyes:     Pupils: Pupils are equal, round, and reactive to light.  Cardiovascular:     Rate and Rhythm: Normal rate and regular rhythm.     Heart sounds: Normal heart sounds. No murmur heard. Pulmonary:     Effort: Pulmonary effort is normal. No respiratory distress.     Breath sounds: Normal breath sounds.  Abdominal:     General: Bowel sounds are normal.     Palpations: Abdomen is soft.     Tenderness: There is no abdominal tenderness.  Musculoskeletal:        General: Normal range of motion.     Cervical back: Normal range of motion.     Right lower leg: No edema.     Left lower leg: No edema.  Lymphadenopathy:     Cervical: No cervical adenopathy.  Skin:    General: Skin is warm and dry.  Neurological:     Mental Status: He is alert and oriented to person, place, and time. Mental status is at baseline.     Gait: Gait normal.  Psychiatric:        Mood and Affect: Mood normal.        Behavior: Behavior normal.     Results for orders placed or performed in visit on 06/13/23  HgB A1c   Collection Time: 06/13/23 10:35 AM  Result Value Ref Range   Hemoglobin A1C 5.7 (A) 4.0 - 5.6 %   HbA1c POC (<> result, manual entry)     HbA1c, POC (prediabetic range)     HbA1c, POC (controlled diabetic range)        Assessment & Plan:   Problem List Items Addressed This Visit       Other   Family history of diabetes mellitus - Primary   Relevant Orders   Basic Metabolic Panel   HgB A1c (Completed)   HYPERCHOLESTEROLEMIA   Relevant Orders   Basic Metabolic Panel   HgB A1c (Completed)   Preventative health care   Relevant Orders   PSA   Basic Metabolic Panel   HgB A1c (Completed)   Other Visit Diagnoses       Family history of prostate cancer       Relevant Orders   PSA        LABORATORY TESTING:  Health maintenance labs ordered today as discussed above.   The natural  history of prostate cancer and ongoing controversy regarding screening and potential treatment outcomes of prostate cancer has been discussed with the patient. The meaning of a false positive PSA and a false negative PSA has been discussed. He indicates understanding of the limitations of this screening test and wishes to proceed with screening PSA testing.   IMMUNIZATIONS:   - Tdap: Tetanus vaccination status reviewed: last tetanus booster within 10 years. - Influenza: Refused - Pneumococcal: Up to date - HPV: Not applicable - Shingrix vaccine:  due, advised to get at pharmacy. - COVID vaccine: UTD  SCREENING: - Colonoscopy: Up to date, aged out of screening Discussed with patient purpose of the colonoscopy is to detect colon cancer at curable precancerous or early stages   - AAA Screening: Up to date  - Lung cancer screening: n/a  Hep C Screening: UTD  PATIENT COUNSELING:    Advanced Care Planning: A voluntary discussion about advance care planning including the explanation and discussion of advance directives.  Discussed health care proxy and Living will, and the patient was able to identify a health care proxy as wife, Retia Passe.  Patient does have a living will at present time. If patient does have living will, I have requested they bring this to the clinic to be scanned in to their chart.  Sexuality: Discussed sexually transmitted diseases, partner selection, use of condoms, avoidance of unintended pregnancy  and contraceptive alternatives.   Advised to avoid cigarette smoking.  I discussed with the patient that most people either abstain from alcohol or drink within safe limits (<=14/week and <=4 drinks/occasion for males, <=7/weeks and <= 3 drinks/occasion for females) and that the risk for alcohol disorders and other health effects rises proportionally with the number of drinks per week and how often a drinker exceeds daily limits.  Discussed cessation/primary prevention  of drug use and availability of treatment for abuse.   Diet: Encouraged to adjust caloric intake to maintain  or achieve ideal body weight, to reduce intake of dietary saturated fat and total fat, to limit sodium intake by avoiding high sodium foods and not adding table salt, and to maintain adequate dietary potassium and calcium preferably from fresh fruits, vegetables, and low-fat dairy products.    stressed the importance of regular exercise  Injury prevention: Discussed safety belts, safety helmets, smoke detector, smoking near bedding or upholstery.   Dental health: Discussed importance of regular tooth brushing, flossing, and dental visits.   Follow up plan: NEXT PREVENTATIVE PHYSICAL DUE IN 1 YEAR. Return in about 1 year (around 06/12/2024) for CPE.

## 2023-06-13 NOTE — Patient Instructions (Addendum)
 John Chung , Thank you for taking time to come for your Medicare Wellness Visit. I appreciate your ongoing commitment to your health goals. Please review the following plan we discussed and let me know if I can assist you in the future.   Referrals/Orders/Follow-Ups/Clinician Recommendations: Yes; Keep maintaining your health by keeping your appointments with Dr. Linwood Dibbles and any specialists that you may see.  Call us if you need anything.  Have a great year!!!!  This is a list of the screening recommended for you and due dates:  Health Maintenance  Topic Date Due   Zoster (Shingles) Vaccine (1 of 2) 07/06/1964   COVID-19 Vaccine (8 - 2024-25 season) 11/14/2022   Flu Shot  06/13/2023*   Medicare Annual Wellness Visit  06/12/2024   DTaP/Tdap/Td vaccine (4 - Td or Tdap) 02/20/2031   Pneumonia Vaccine  Completed   Hepatitis C Screening  Completed   HPV Vaccine  Aged Out   Colon Cancer Screening  Discontinued  *Topic was postponed. The date shown is not the original due date.    Advanced directives: (Copy Requested) Please bring a copy of your health care power of attorney and living will to the office to be added to your chart at your convenience. You can mail to [facility] [address] [place], [place] [address] or email to [email or web address]  Next Medicare Annual Wellness Visit scheduled for next year: Yes

## 2023-06-14 ENCOUNTER — Encounter: Payer: Self-pay | Admitting: Family Medicine

## 2023-06-14 LAB — BASIC METABOLIC PANEL WITH GFR
BUN/Creatinine Ratio: 17 (ref 10–24)
BUN: 15 mg/dL (ref 8–27)
CO2: 24 mmol/L (ref 20–29)
Calcium: 9.3 mg/dL (ref 8.6–10.2)
Chloride: 102 mmol/L (ref 96–106)
Creatinine, Ser: 0.89 mg/dL (ref 0.76–1.27)
Glucose: 86 mg/dL (ref 70–99)
Potassium: 4.8 mmol/L (ref 3.5–5.2)
Sodium: 140 mmol/L (ref 134–144)
eGFR: 88 mL/min/{1.73_m2} (ref >59)

## 2023-06-14 LAB — PSA: Prostate Specific Ag, Serum: 1.9 ng/mL (ref 0.0–4.0)

## 2023-06-15 ENCOUNTER — Other Ambulatory Visit: Payer: Self-pay | Admitting: Medical Genetics

## 2023-11-16 ENCOUNTER — Other Ambulatory Visit: Payer: Self-pay | Admitting: Family Medicine

## 2023-11-16 DIAGNOSIS — E7800 Pure hypercholesterolemia, unspecified: Secondary | ICD-10-CM

## 2023-11-17 ENCOUNTER — Ambulatory Visit (INDEPENDENT_AMBULATORY_CARE_PROVIDER_SITE_OTHER)

## 2023-11-17 DIAGNOSIS — Z23 Encounter for immunization: Secondary | ICD-10-CM

## 2023-11-17 NOTE — Progress Notes (Signed)
 Patient presents to nurse clinic for Covid and Flu vaccines.  Both vaccines administered without complication.  See admin for details.

## 2024-01-05 DIAGNOSIS — Z833 Family history of diabetes mellitus: Secondary | ICD-10-CM | POA: Diagnosis not present

## 2024-01-05 DIAGNOSIS — Z87891 Personal history of nicotine dependence: Secondary | ICD-10-CM | POA: Diagnosis not present

## 2024-01-05 DIAGNOSIS — E785 Hyperlipidemia, unspecified: Secondary | ICD-10-CM | POA: Diagnosis not present

## 2024-01-05 DIAGNOSIS — Z8249 Family history of ischemic heart disease and other diseases of the circulatory system: Secondary | ICD-10-CM | POA: Diagnosis not present

## 2024-01-05 DIAGNOSIS — R030 Elevated blood-pressure reading, without diagnosis of hypertension: Secondary | ICD-10-CM | POA: Diagnosis not present

## 2024-01-05 DIAGNOSIS — Z85828 Personal history of other malignant neoplasm of skin: Secondary | ICD-10-CM | POA: Diagnosis not present

## 2024-01-05 DIAGNOSIS — M1990 Unspecified osteoarthritis, unspecified site: Secondary | ICD-10-CM | POA: Diagnosis not present

## 2024-01-30 DIAGNOSIS — C44622 Squamous cell carcinoma of skin of right upper limb, including shoulder: Secondary | ICD-10-CM | POA: Diagnosis not present

## 2024-06-14 ENCOUNTER — Encounter
